# Patient Record
Sex: Female | Born: 1955 | Race: Black or African American | Hispanic: No | Smoking: Never smoker
Health system: Southern US, Community
[De-identification: ages and names within clinical notes are randomized; demographics above are authoritative.]

## PROBLEM LIST (undated history)

## (undated) DIAGNOSIS — M797 Fibromyalgia: Secondary | ICD-10-CM

## (undated) DIAGNOSIS — K219 Gastro-esophageal reflux disease without esophagitis: Secondary | ICD-10-CM

## (undated) DIAGNOSIS — F32A Depression, unspecified: Secondary | ICD-10-CM

## (undated) DIAGNOSIS — N920 Excessive and frequent menstruation with regular cycle: Secondary | ICD-10-CM

## (undated) DIAGNOSIS — M1990 Unspecified osteoarthritis, unspecified site: Secondary | ICD-10-CM

## (undated) DIAGNOSIS — M5126 Other intervertebral disc displacement, lumbar region: Secondary | ICD-10-CM

## (undated) DIAGNOSIS — F329 Major depressive disorder, single episode, unspecified: Secondary | ICD-10-CM

## (undated) DIAGNOSIS — I10 Essential (primary) hypertension: Secondary | ICD-10-CM

## (undated) HISTORY — DX: Depression, unspecified: F32.A

## (undated) HISTORY — DX: Major depressive disorder, single episode, unspecified: F32.9

## (undated) HISTORY — PX: ENDOMETRIAL ABLATION: SHX621

## (undated) HISTORY — PX: CHOLECYSTECTOMY: SHX55

## (undated) HISTORY — DX: Excessive and frequent menstruation with regular cycle: N92.0

## (undated) HISTORY — DX: Unspecified osteoarthritis, unspecified site: M19.90

## (undated) HISTORY — PX: MOUTH SURGERY: SHX715

## (undated) HISTORY — DX: Essential (primary) hypertension: I10

## (undated) HISTORY — PX: ESOPHAGEAL DILATION: SHX303

## (undated) HISTORY — DX: Fibromyalgia: M79.7

---

## 2008-07-23 ENCOUNTER — Emergency Department (HOSPITAL_COMMUNITY): Admission: EM | Admit: 2008-07-23 | Discharge: 2008-07-23 | Payer: Self-pay | Admitting: Emergency Medicine

## 2008-07-28 ENCOUNTER — Ambulatory Visit: Payer: Self-pay | Admitting: *Deleted

## 2008-08-01 ENCOUNTER — Emergency Department (HOSPITAL_COMMUNITY): Admission: EM | Admit: 2008-08-01 | Discharge: 2008-08-01 | Payer: Self-pay | Admitting: Family Medicine

## 2008-08-07 ENCOUNTER — Ambulatory Visit (HOSPITAL_COMMUNITY): Admission: RE | Admit: 2008-08-07 | Discharge: 2008-08-07 | Payer: Self-pay | Admitting: Family Medicine

## 2008-08-28 ENCOUNTER — Ambulatory Visit: Payer: Self-pay | Admitting: Family Medicine

## 2008-08-28 LAB — CONVERTED CEMR LAB
ALT: 13 units/L (ref 0–35)
Albumin: 4.3 g/dL (ref 3.5–5.2)
Basophils Absolute: 0 10*3/uL (ref 0.0–0.1)
Basophils Relative: 1 % (ref 0–1)
Calcium: 9.6 mg/dL (ref 8.4–10.5)
Chloride: 104 meq/L (ref 96–112)
Eosinophils Relative: 2 % (ref 0–5)
Glucose, Bld: 81 mg/dL (ref 70–99)
Hemoglobin: 10.9 g/dL — ABNORMAL LOW (ref 12.0–15.0)
Lymphocytes Relative: 52 % — ABNORMAL HIGH (ref 12–46)
Lymphs Abs: 2 10*3/uL (ref 0.7–4.0)
Monocytes Relative: 8 % (ref 3–12)
TSH: 1.427 microintl units/mL (ref 0.350–4.50)
Total Bilirubin: 0.6 mg/dL (ref 0.3–1.2)

## 2008-09-21 ENCOUNTER — Ambulatory Visit: Payer: Self-pay | Admitting: Internal Medicine

## 2008-10-28 ENCOUNTER — Ambulatory Visit: Payer: Self-pay | Admitting: Internal Medicine

## 2008-11-18 ENCOUNTER — Ambulatory Visit (HOSPITAL_COMMUNITY): Admission: RE | Admit: 2008-11-18 | Discharge: 2008-11-18 | Payer: Self-pay | Admitting: Family Medicine

## 2008-11-18 ENCOUNTER — Emergency Department (HOSPITAL_COMMUNITY): Admission: EM | Admit: 2008-11-18 | Discharge: 2008-11-18 | Payer: Self-pay | Admitting: Family Medicine

## 2008-12-10 ENCOUNTER — Ambulatory Visit: Payer: Self-pay | Admitting: Family Medicine

## 2008-12-28 ENCOUNTER — Encounter: Admission: RE | Admit: 2008-12-28 | Discharge: 2009-01-28 | Payer: Self-pay | Admitting: Family Medicine

## 2009-01-04 ENCOUNTER — Encounter: Payer: Self-pay | Admitting: Internal Medicine

## 2009-01-04 ENCOUNTER — Ambulatory Visit: Payer: Self-pay | Admitting: Family Medicine

## 2009-01-04 LAB — CONVERTED CEMR LAB
ALT: 11 units/L (ref 0–35)
Albumin: 4.2 g/dL (ref 3.5–5.2)
Alkaline Phosphatase: 88 units/L (ref 39–117)
Basophils Absolute: 0 10*3/uL (ref 0.0–0.1)
Calcium: 8.9 mg/dL (ref 8.4–10.5)
Hemoglobin: 10.6 g/dL — ABNORMAL LOW (ref 12.0–15.0)
Lymphocytes Relative: 53 % — ABNORMAL HIGH (ref 12–46)
Lymphs Abs: 2 10*3/uL (ref 0.7–4.0)
MCHC: 31.4 g/dL (ref 30.0–36.0)
Monocytes Absolute: 0.3 10*3/uL (ref 0.1–1.0)
Neutro Abs: 1.4 10*3/uL — ABNORMAL LOW (ref 1.7–7.7)
Neutrophils Relative %: 37 % — ABNORMAL LOW (ref 43–77)
Platelets: 424 10*3/uL — ABNORMAL HIGH (ref 150–400)
TSH: 1.808 microintl units/mL (ref 0.350–4.50)
Total Bilirubin: 0.4 mg/dL (ref 0.3–1.2)
Total Protein: 7.4 g/dL (ref 6.0–8.3)
WBC: 3.7 10*3/uL — ABNORMAL LOW (ref 4.0–10.5)

## 2009-01-14 ENCOUNTER — Ambulatory Visit: Payer: Self-pay | Admitting: Internal Medicine

## 2009-01-15 ENCOUNTER — Ambulatory Visit: Payer: Self-pay | Admitting: Internal Medicine

## 2009-01-27 ENCOUNTER — Ambulatory Visit: Payer: Self-pay | Admitting: Obstetrics & Gynecology

## 2009-01-27 LAB — CONVERTED CEMR LAB
HCT: 31.9 % — ABNORMAL LOW (ref 36.0–46.0)
MCV: 75.1 fL — ABNORMAL LOW (ref 78.0–100.0)
RBC: 4.25 M/uL (ref 3.87–5.11)

## 2009-01-29 ENCOUNTER — Ambulatory Visit (HOSPITAL_COMMUNITY): Admission: RE | Admit: 2009-01-29 | Discharge: 2009-01-29 | Payer: Self-pay | Admitting: Obstetrics and Gynecology

## 2009-02-18 ENCOUNTER — Ambulatory Visit: Payer: Self-pay | Admitting: Obstetrics and Gynecology

## 2009-02-25 ENCOUNTER — Ambulatory Visit: Payer: Self-pay | Admitting: Family Medicine

## 2009-02-26 ENCOUNTER — Other Ambulatory Visit: Admission: RE | Admit: 2009-02-26 | Discharge: 2009-02-26 | Payer: Self-pay | Admitting: Obstetrics & Gynecology

## 2009-02-26 ENCOUNTER — Ambulatory Visit: Payer: Self-pay | Admitting: Obstetrics & Gynecology

## 2009-03-05 ENCOUNTER — Ambulatory Visit (HOSPITAL_COMMUNITY): Admission: RE | Admit: 2009-03-05 | Discharge: 2009-03-05 | Payer: Self-pay | Admitting: Family Medicine

## 2009-05-13 ENCOUNTER — Ambulatory Visit: Payer: Self-pay | Admitting: Obstetrics and Gynecology

## 2009-05-13 ENCOUNTER — Encounter: Payer: Self-pay | Admitting: Obstetrics and Gynecology

## 2009-06-10 ENCOUNTER — Ambulatory Visit: Payer: Self-pay | Admitting: Family Medicine

## 2009-07-14 ENCOUNTER — Inpatient Hospital Stay (HOSPITAL_COMMUNITY): Admission: AD | Admit: 2009-07-14 | Discharge: 2009-07-14 | Payer: Self-pay | Admitting: Obstetrics & Gynecology

## 2009-08-10 ENCOUNTER — Ambulatory Visit (HOSPITAL_COMMUNITY): Admission: RE | Admit: 2009-08-10 | Discharge: 2009-08-10 | Payer: Self-pay | Admitting: Family Medicine

## 2009-08-25 ENCOUNTER — Ambulatory Visit: Payer: Self-pay | Admitting: Advanced Practice Midwife

## 2009-08-25 ENCOUNTER — Inpatient Hospital Stay (HOSPITAL_COMMUNITY): Admission: AD | Admit: 2009-08-25 | Discharge: 2009-08-25 | Payer: Self-pay | Admitting: Obstetrics & Gynecology

## 2009-08-27 ENCOUNTER — Inpatient Hospital Stay (HOSPITAL_COMMUNITY): Admission: RE | Admit: 2009-08-27 | Discharge: 2009-08-27 | Payer: Self-pay | Admitting: Family Medicine

## 2009-08-27 ENCOUNTER — Ambulatory Visit: Payer: Self-pay | Admitting: Obstetrics and Gynecology

## 2009-09-08 ENCOUNTER — Ambulatory Visit: Payer: Self-pay | Admitting: Obstetrics and Gynecology

## 2009-09-16 ENCOUNTER — Ambulatory Visit: Payer: Self-pay | Admitting: Obstetrics & Gynecology

## 2009-09-16 ENCOUNTER — Encounter: Payer: Self-pay | Admitting: Obstetrics & Gynecology

## 2009-09-16 ENCOUNTER — Ambulatory Visit (HOSPITAL_COMMUNITY): Admission: RE | Admit: 2009-09-16 | Discharge: 2009-09-16 | Payer: Self-pay | Admitting: Obstetrics & Gynecology

## 2009-10-02 ENCOUNTER — Emergency Department (HOSPITAL_COMMUNITY): Admission: EM | Admit: 2009-10-02 | Discharge: 2009-10-02 | Payer: Self-pay | Admitting: Emergency Medicine

## 2009-10-14 ENCOUNTER — Ambulatory Visit: Payer: Self-pay | Admitting: Obstetrics and Gynecology

## 2009-11-02 ENCOUNTER — Ambulatory Visit: Payer: Self-pay | Admitting: Family Medicine

## 2009-12-14 ENCOUNTER — Telehealth (INDEPENDENT_AMBULATORY_CARE_PROVIDER_SITE_OTHER): Payer: Self-pay | Admitting: *Deleted

## 2009-12-16 ENCOUNTER — Ambulatory Visit: Payer: Self-pay | Admitting: Adult Health

## 2009-12-16 ENCOUNTER — Encounter (INDEPENDENT_AMBULATORY_CARE_PROVIDER_SITE_OTHER): Payer: Self-pay | Admitting: Family Medicine

## 2009-12-16 LAB — CONVERTED CEMR LAB
BUN: 11 mg/dL
CO2: 24 meq/L
Calcium: 8.7 mg/dL
Chloride: 105 meq/L
Creatinine, Ser: 0.68 mg/dL
Glucose, Bld: 99 mg/dL
Potassium: 3.5 meq/L
Sodium: 140 meq/L

## 2009-12-22 ENCOUNTER — Ambulatory Visit (HOSPITAL_COMMUNITY): Admission: RE | Admit: 2009-12-22 | Discharge: 2009-12-22 | Payer: Self-pay | Admitting: Family Medicine

## 2010-01-03 ENCOUNTER — Telehealth (INDEPENDENT_AMBULATORY_CARE_PROVIDER_SITE_OTHER): Payer: Self-pay | Admitting: *Deleted

## 2010-01-04 ENCOUNTER — Ambulatory Visit: Payer: Self-pay | Admitting: Family Medicine

## 2010-01-04 ENCOUNTER — Encounter (INDEPENDENT_AMBULATORY_CARE_PROVIDER_SITE_OTHER): Payer: Self-pay | Admitting: Adult Health

## 2010-01-04 LAB — CONVERTED CEMR LAB
AST: 15 units/L (ref 0–37)
Albumin: 4.5 g/dL (ref 3.5–5.2)
Alkaline Phosphatase: 102 units/L (ref 39–117)
Basophils Relative: 1 % (ref 0–1)
Calcium: 8.9 mg/dL (ref 8.4–10.5)
Chloride: 102 meq/L (ref 96–112)
Creatinine, Ser: 0.86 mg/dL (ref 0.40–1.20)
Eosinophils Absolute: 0.1 10*3/uL (ref 0.0–0.7)
Glucose, Bld: 91 mg/dL (ref 70–99)
HCT: 35.5 % — ABNORMAL LOW (ref 36.0–46.0)
Helicobacter Pylori Antibody-IgG: 1.1 — ABNORMAL HIGH
Hemoglobin: 11.5 g/dL — ABNORMAL LOW (ref 12.0–15.0)
Lymphocytes Relative: 51 % — ABNORMAL HIGH (ref 12–46)
Monocytes Absolute: 0.3 10*3/uL (ref 0.1–1.0)
Platelets: 408 10*3/uL — ABNORMAL HIGH (ref 150–400)
Potassium: 4 meq/L (ref 3.5–5.3)
RBC: 4.73 M/uL (ref 3.87–5.11)
RDW: 18.3 % — ABNORMAL HIGH (ref 11.5–15.5)
Total Protein: 7.7 g/dL (ref 6.0–8.3)

## 2010-01-13 ENCOUNTER — Ambulatory Visit: Payer: Self-pay | Admitting: Family Medicine

## 2010-03-08 ENCOUNTER — Encounter (INDEPENDENT_AMBULATORY_CARE_PROVIDER_SITE_OTHER): Payer: Self-pay | Admitting: Adult Health

## 2010-03-08 ENCOUNTER — Ambulatory Visit: Payer: Self-pay | Admitting: Family Medicine

## 2010-03-08 LAB — CONVERTED CEMR LAB
Amylase: 47 units/L (ref 0–105)
Basophils Absolute: 0 10*3/uL (ref 0.0–0.1)
Basophils Relative: 1 % (ref 0–1)
Eosinophils Relative: 2 % (ref 0–5)
Hemoglobin: 12.7 g/dL (ref 12.0–15.0)
Lipase: 20 units/L (ref 0–75)
Lymphocytes Relative: 59 % — ABNORMAL HIGH (ref 12–46)
MCHC: 34 g/dL (ref 30.0–36.0)
Microalb, Ur: 0.5 mg/dL (ref 0.00–1.89)
WBC: 3.2 10*3/uL — ABNORMAL LOW (ref 4.0–10.5)

## 2010-03-31 ENCOUNTER — Ambulatory Visit: Payer: Self-pay | Admitting: Family Medicine

## 2010-10-11 IMAGING — US US PELVIS COMPLETE MODIFY
1 series · 13 of 25 positions shown · non-contrast
Comparison: None

CLINICAL DATA: Dysfunctional uterine bleeding.  LMP 01/06/2009.

TRANSABDOMINAL AND TRANSVAGINAL ULTRASOUND OF PELVIS
TECHNIQUE: Both transabdominal and transvaginal ultrasound
examinations of the pelvis were performed including evaluation of
the uterus, ovaries, adnexal regions, and pelvic cul-de-sac.

[Series 1: us transvaginal non-ob · 13 of 49 slices shown]
[im 1/49]
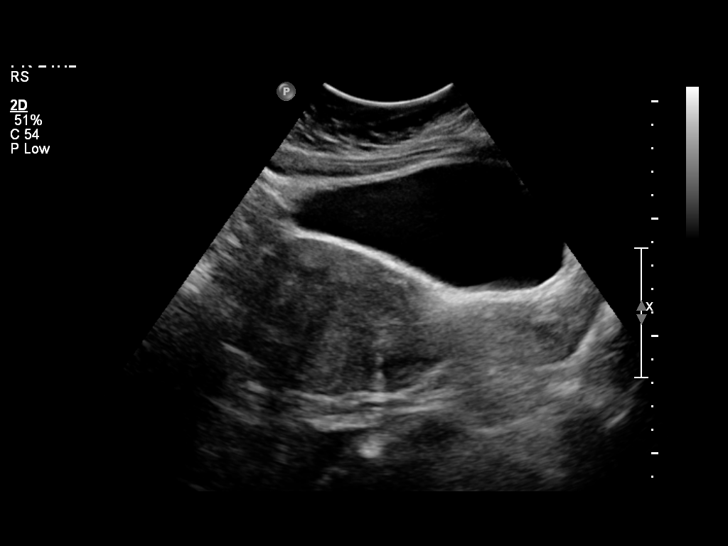
[im 5/49]
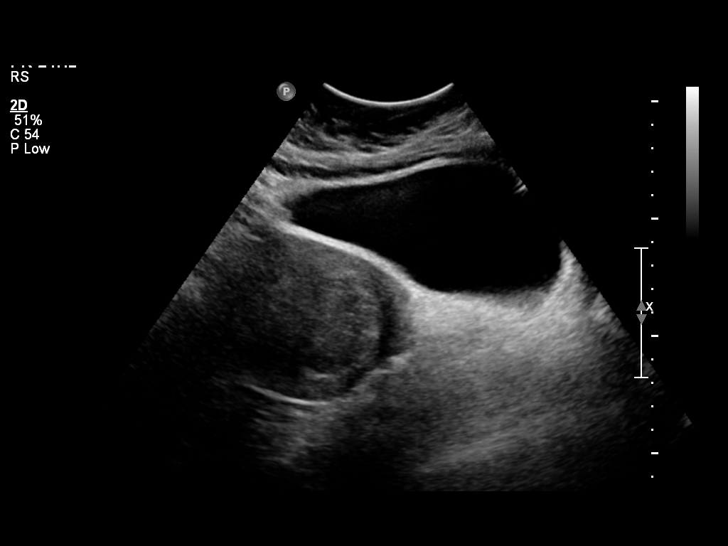
[im 9/49]
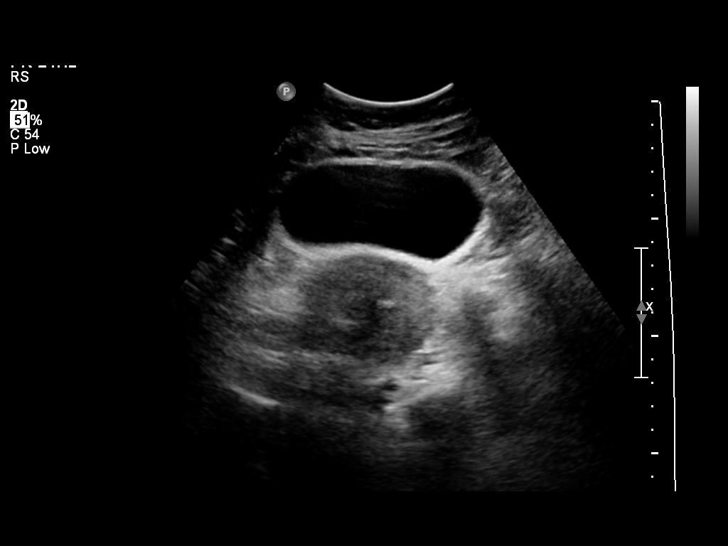
[im 13/49]
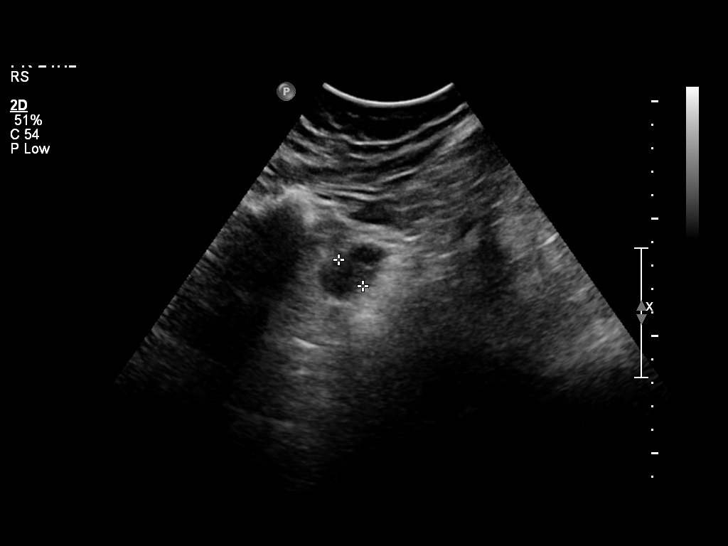
[im 17/49]
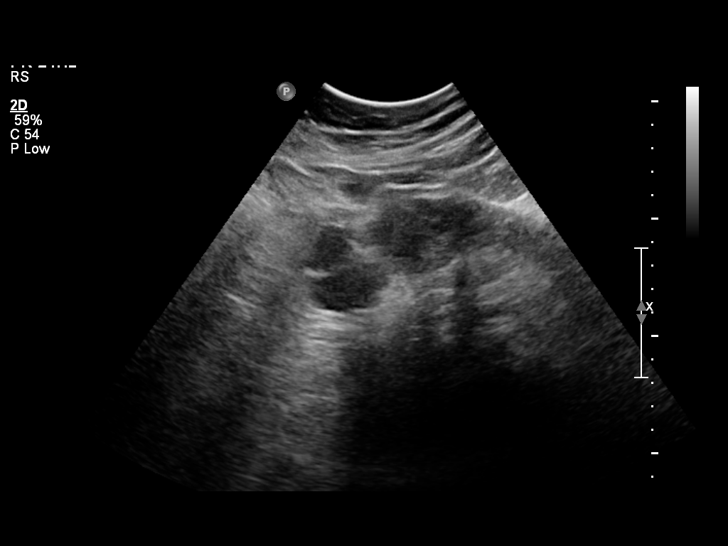
[im 21/49]
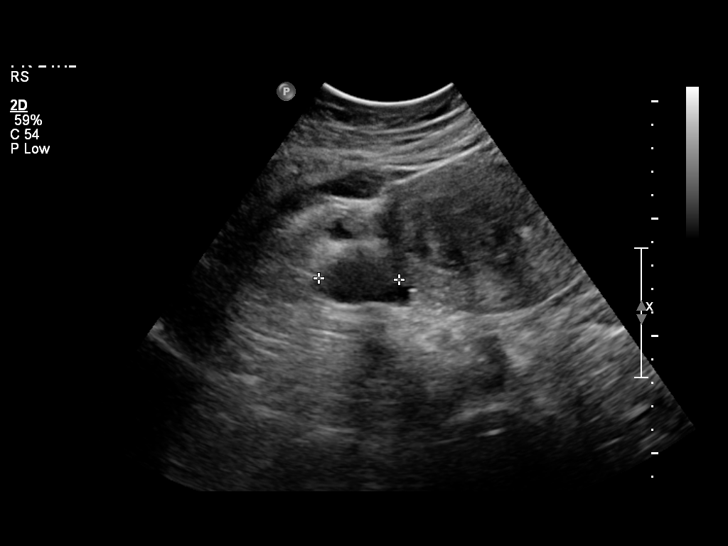
[im 25/49]
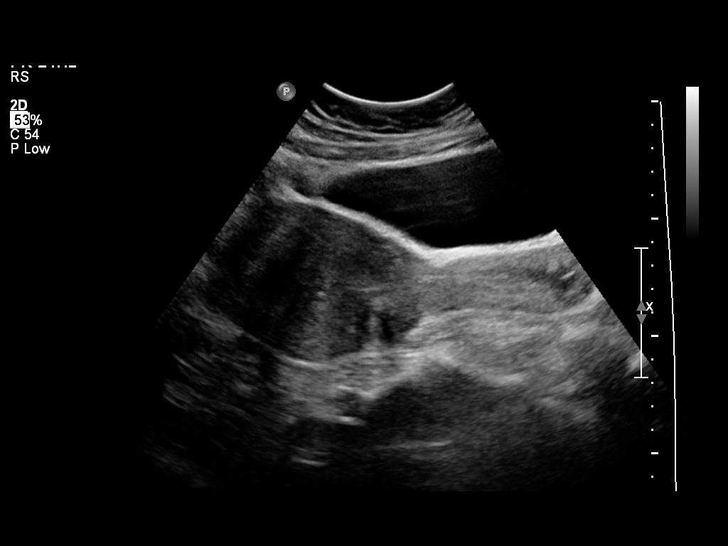
[im 29/49]
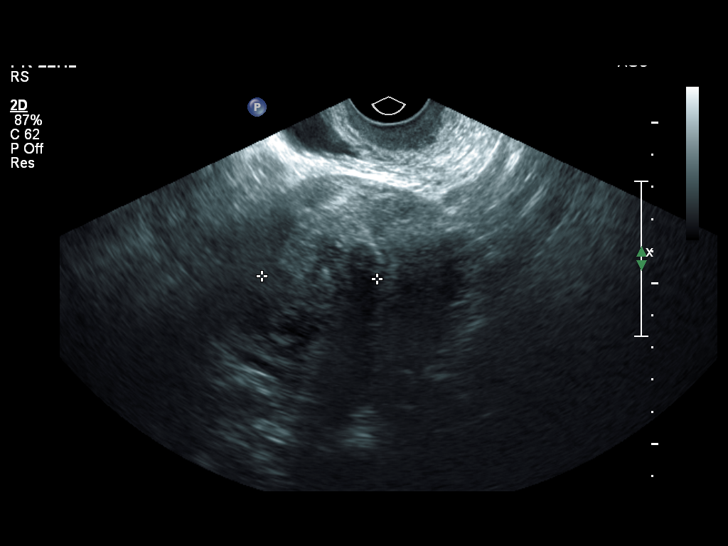
[im 33/49]
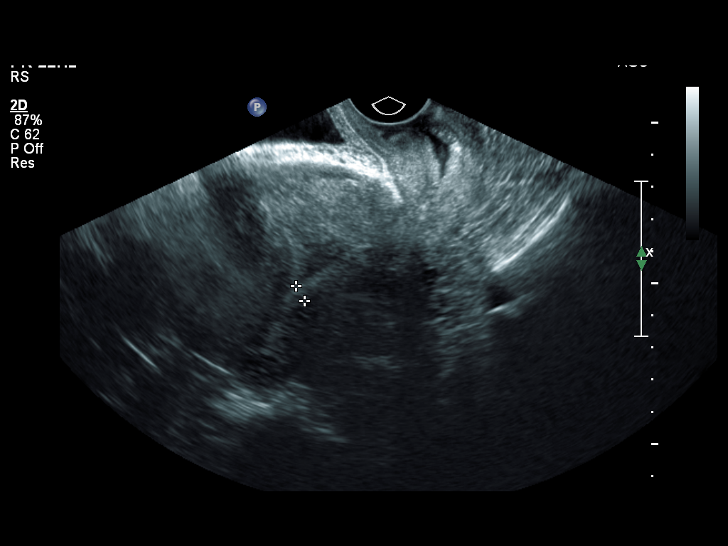
[im 37/49]
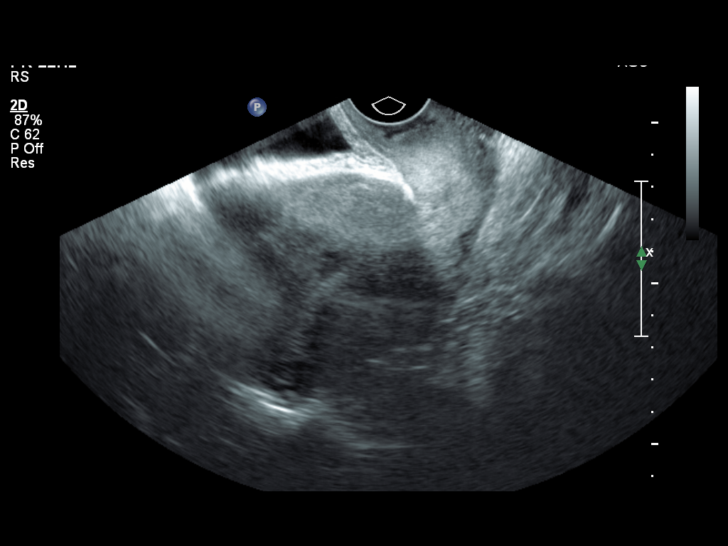
[im 41/49]
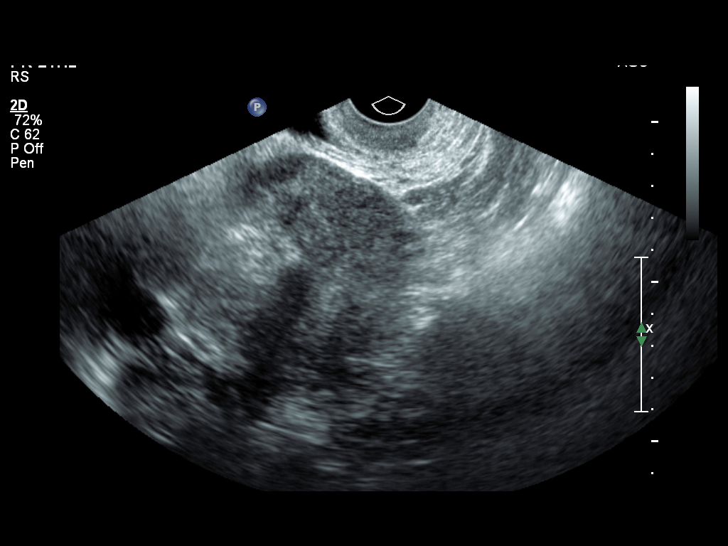
[im 45/49]
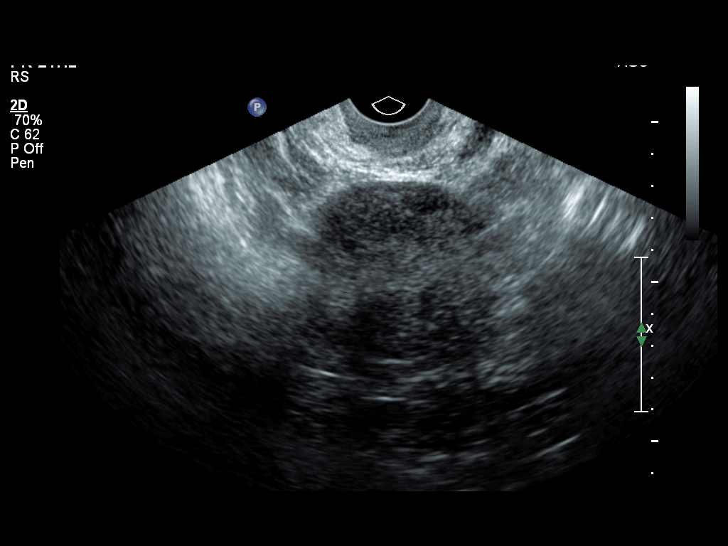
[im 49/49]
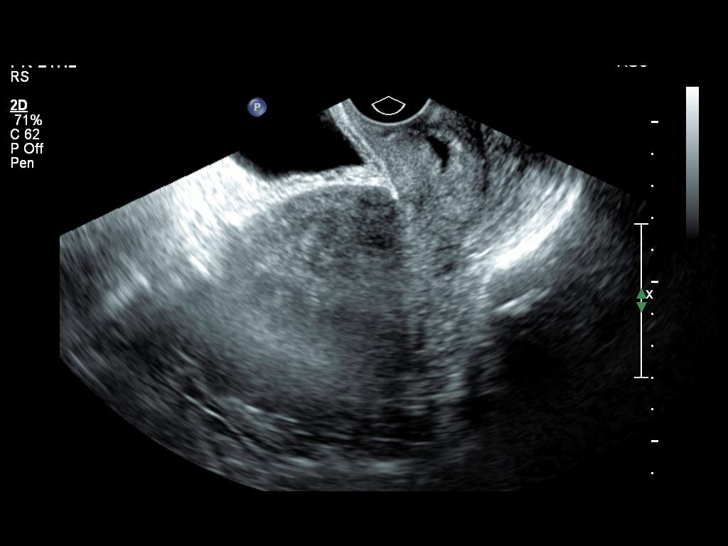

[13 of 25 positions shown; findings below may reference images not displayed]

FINDINGS: Uterus:  The uterus is enlarged with a sagittal length measured
transabdominally of 14.5 cm, an AP width of 6.6 cm and a transverse
width of 7.4 cm.  Endovaginally the fundal portion of the uterus is
incompletely assessed due to the prominent uterine size.  One focal
fibroid is identified involving the right lateral upper uterine
segment measuring 4.5 x 3.9 x 3.6 cm.  More diffuse fibroid
involvement of the fundus may be present but poorly evaluated due
to the poor visibility of the fundal portion of the uterus
endovaginally.

Endometrium:  The endometrial lining to the midportion of the
uterus is thin and echogenic with an AP width of 5.4 mm.  The
fundal portion of the endometrium is obscured by the aforementioned
fibroids and incompletely evaluated.  The possibility of a partial
submucosal component to the patient's measurable fibroid exists.

Right Ovary:  The right ovary measures 3.3 x 1.8 x 3.4 cm and is
also best seen transabdominally.

Left Ovary:  The left ovary is seen transabdominally measuring
x 1.8 x 2.9 cm.

Other Findings:  No pelvic fluid is noted.
IMPRESSION: Enlarged uterine size with one measurable focal fibroid.  Poor
evaluation ofthe uterine fundus is possible due to the imaging
characteristics transabdominally combined with inability to
visualize the fundus endovaginally.  More diffuse fibroid
involvement may be present.  Incomplete visualization of the
endometrial lining as described above.  The visualized portion of
the lining appears unremarkable. If further assessment of the
relationship of the endometrial lining to the patient's.
measurable fibroid as well as evaluation for non-visualized
fibroids is desired, MRI would be recommended for further
evaluation.

Normal ovaries.

## 2010-11-01 ENCOUNTER — Encounter (INDEPENDENT_AMBULATORY_CARE_PROVIDER_SITE_OTHER): Payer: Self-pay | Admitting: Family Medicine

## 2010-11-01 LAB — CONVERTED CEMR LAB
Creatinine, Ser: 0.7 mg/dL (ref 0.40–1.20)
Glucose, Bld: 85 mg/dL (ref 70–99)
Sodium: 144 meq/L (ref 135–145)

## 2010-11-03 ENCOUNTER — Ambulatory Visit: Payer: Self-pay | Admitting: Family Medicine

## 2010-11-03 ENCOUNTER — Ambulatory Visit (HOSPITAL_COMMUNITY)
Admission: RE | Admit: 2010-11-03 | Discharge: 2010-11-03 | Payer: Self-pay | Source: Home / Self Care | Attending: Family Medicine | Admitting: Family Medicine

## 2010-11-03 ENCOUNTER — Encounter: Payer: Self-pay | Admitting: Family Medicine

## 2010-11-03 LAB — CONVERTED CEMR LAB
FSH: 56.2 milliintl units/mL
HCT: 36.6 % (ref 36.0–46.0)
Hemoglobin: 12.7 g/dL (ref 12.0–15.0)
Pap Smear: NEGATIVE
RBC: 4.26 M/uL (ref 3.87–5.11)
RDW: 13.1 % (ref 11.5–15.5)
TSH: 1.037 microintl units/mL (ref 0.350–4.500)

## 2010-11-09 ENCOUNTER — Ambulatory Visit (HOSPITAL_COMMUNITY)
Admission: RE | Admit: 2010-11-09 | Discharge: 2010-11-09 | Payer: Self-pay | Source: Home / Self Care | Attending: Gastroenterology | Admitting: Gastroenterology

## 2010-11-17 ENCOUNTER — Ambulatory Visit (HOSPITAL_COMMUNITY): Admission: RE | Admit: 2010-11-17 | Payer: Self-pay | Source: Home / Self Care | Admitting: Gastroenterology

## 2010-12-13 NOTE — Progress Notes (Signed)
Summary: triage/heart palpatations  Phone Note Call from Patient   Caller: Patient Reason for Call: Talk to Nurse Summary of Call: patient stated she felt she was having a side effect from medication.  She is talking doxazosin mesylate 2 mg daily at HS..she began taking 1/2 pill for 7 days and then when the dose increased to one she would have occaisionaly heart palpatations..She is also taking 1/2 triamterene/HCTZ daily in am..She states her feet are swelling.  She states she cannot come in today d/t no transportation.Marland KitchenMarland KitchenShe understands should she has chest pain/severe SOB she should go to ED.Marland KitchenMarland KitchenAppointment made tomorrow per Dr. Audria Nine..patient will be in [facility].Marland KitchenMarland KitchenShe will need an EKG (none in chart) Initial call taken by: Conchita Paris,  December 14, 2009 3:39 PM

## 2010-12-13 NOTE — Progress Notes (Signed)
Summary: triage/difficulty swollowing  Phone Note Call from Patient   Reason for Call: Talk to Nurse Summary of Call: Patient states she is having trouble swollowing...has a history of having her esphagus stretched for this problem.Marland KitchenMarland KitchenShe states she had been taking Nexium and was off of it..Picked up rx for acid reflux and has been taking for a few days..She also states she has been having chest pain but feels like it is acid reflux because she has it after she eats. Patient has a history of heart palpations and had an EKG which patient states was normal..Offered patient an appointment today, but she doesn't have transportation, so appointment was made for tomorrow. Advised patient to call 911 should her chest pains persist unrelieved by anacid, numbness in hands. Patient states understanding Initial call taken by: Conchita Paris,  January 03, 2010 11:59 AM

## 2010-12-14 ENCOUNTER — Encounter: Payer: Self-pay | Admitting: Gastroenterology

## 2010-12-15 ENCOUNTER — Other Ambulatory Visit (HOSPITAL_COMMUNITY): Payer: Self-pay

## 2010-12-15 LAB — COMPREHENSIVE METABOLIC PANEL
ALT: 27 U/L (ref 0–35)
Albumin: 3.9 g/dL (ref 3.5–5.2)
Alkaline Phosphatase: 120 U/L — ABNORMAL HIGH (ref 39–117)
BUN: 14 mg/dL (ref 6–23)
CO2: 28 mEq/L (ref 19–32)
Calcium: 9.5 mg/dL (ref 8.4–10.5)
Creatinine, Ser: 0.72 mg/dL (ref 0.4–1.2)
GFR calc non Af Amer: >60 mL/min (ref >60)
Sodium: 138 mEq/L (ref 135–145)
Total Bilirubin: 0.8 mg/dL (ref 0.3–1.2)
Total Protein: 7.6 g/dL (ref 6.0–8.3)

## 2010-12-15 LAB — CBC
HCT: 36.9 % (ref 36.0–46.0)
Hemoglobin: 13 g/dL (ref 12.0–15.0)
Platelets: 310 10*3/uL (ref 150–400)
RBC: 4.32 MIL/uL (ref 3.87–5.11)
WBC: 2.9 10*3/uL — ABNORMAL LOW (ref 4.0–10.5)

## 2010-12-15 LAB — SURGICAL PCR SCREEN: Staphylococcus aureus: NEGATIVE

## 2010-12-22 ENCOUNTER — Ambulatory Visit (HOSPITAL_COMMUNITY)
Admission: RE | Admit: 2010-12-22 | Discharge: 2010-12-22 | Disposition: A | Discharge status: Home / Self Care | Payer: Self-pay | Source: Ambulatory Visit | Attending: Surgery | Admitting: Surgery

## 2010-12-22 ENCOUNTER — Other Ambulatory Visit: Payer: Self-pay | Admitting: Surgery

## 2010-12-22 DIAGNOSIS — K8020 Calculus of gallbladder without cholecystitis without obstruction: Secondary | ICD-10-CM | POA: Insufficient documentation

## 2010-12-22 DIAGNOSIS — K8010 Calculus of gallbladder with chronic cholecystitis without obstruction: Secondary | ICD-10-CM | POA: Insufficient documentation

## 2010-12-28 NOTE — Op Note (Signed)
  NAMESHERISSE, Carolyn Wall             ACCOUNT NO.:  [id number]  MEDICAL RECORD NO.:  [id number]           PATIENT TYPE:  O  LOCATION:  [facility]                         FACILITY:  [facility]  PHYSICIAN:  Abigail Miyamoto, M.D. DATE OF BIRTH:  10-Apr-1956  DATE OF PROCEDURE:  12/22/2010 DATE OF DISCHARGE:                              OPERATIVE REPORT   PREOPERATIVE DIAGNOSIS:  Symptomatic cholelithiasis.  POSTOPERATIVE DIAGNOSIS:  Symptomatic cholelithiasis.  PROCEDURE:  Laparoscopic cholecystectomy.  SURGEON:  Abigail Miyamoto, M.D.  ASSISTANT:  Ardeth Sportsman, MD.  ANESTHESIA:  General endotracheal anesthesia and 0.5% Marcaine.  ESTIMATED BLOOD LOSS:  Minimal.  FINDINGS:  The patient has minimally scarred-appearing gallbladder with gallstones.  PROCEDURE IN DETAIL:  The patient was brought to the Operating Room, identified as [name].  She was placed supine on the operating table and general anesthesia was induced.  Her abdomen was prepped and draped in usual sterile fashion.  Using #15 blade, a small vertical incision was made above the umbilicus.  This was carried down to the fascia which was then opened scalpel.  A hemostat was used to pass the peritoneal cavity under direct vision.  Next, a 0 Vicryl sutures placed on the fascial opening.  The [facility] port was placed at the opening and insufflation of the abdomen begun.  A 5-mm port was then placed in epigastrium and two more 5-mm ports were placed in the patient's upper quadrant all under direct vision.  The patient had several adhesions from the dome of the liver to the diaphragm.  I took these down with the laparoscopic scissors.  I was then able to easily grasp the gallbladder and retracted it above the liver bed.  Dissection was then carried out the base the gallbladder.  The cystic duct was easily dissected out and window was achieved around.  It was clipped three times proximally, once distally and transected.  The  cystic artery posterior branches identified clip proximally, distally and transected as well.  The gallbladder was slowly dissected free from the liver bed with electrocautery.  Once it was free from liver bed, hemostasis was achieved with cautery.  The gallbladder was placed in endosac and removed through the incision at the umbilicus.  The abdomen was then copiously irrigated with normal saline.  Again, hemostasis appeared be achieved.  All ports removed under direct vision.  The abdomen was deflated.  All incisions were then anesthetized with Marcaine.  The 0 Vicryl umbilicus was closed the fascial defect.  All skin incisions closed with 4-0 Monocryl sutures and skin adhesive.  Band-Aids were then applied.  The patient tolerated the procedure well.  All counts were correct at the end of procedure.  The patient was then extubated in Operating Room and taken in stable condition to recovery room.     Abigail Miyamoto, M.D.     DB/MEDQ  D:  12/22/2010  T:  12/22/2010  Job:  [id number]  Electronically Signed by Abigail Miyamoto M.D. on 12/28/2010 07:29:33 PM

## 2011-02-06 ENCOUNTER — Ambulatory Visit (HOSPITAL_COMMUNITY)
Admission: RE | Admit: 2011-02-06 | Discharge: 2011-02-06 | Disposition: A | Discharge status: Home / Self Care | Payer: Self-pay | Source: Ambulatory Visit | Attending: Family Medicine | Admitting: Family Medicine

## 2011-02-06 ENCOUNTER — Other Ambulatory Visit (HOSPITAL_COMMUNITY): Payer: Self-pay | Admitting: Family Medicine

## 2011-02-06 DIAGNOSIS — M169 Osteoarthritis of hip, unspecified: Secondary | ICD-10-CM | POA: Insufficient documentation

## 2011-02-06 DIAGNOSIS — M1610 Unilateral primary osteoarthritis, unspecified hip: Secondary | ICD-10-CM | POA: Insufficient documentation

## 2011-02-06 DIAGNOSIS — M545 Low back pain, unspecified: Secondary | ICD-10-CM | POA: Insufficient documentation

## 2011-02-06 DIAGNOSIS — R52 Pain, unspecified: Secondary | ICD-10-CM

## 2011-02-06 DIAGNOSIS — M19019 Primary osteoarthritis, unspecified shoulder: Secondary | ICD-10-CM | POA: Insufficient documentation

## 2011-02-06 DIAGNOSIS — M25519 Pain in unspecified shoulder: Secondary | ICD-10-CM | POA: Insufficient documentation

## 2011-02-06 DIAGNOSIS — M25559 Pain in unspecified hip: Secondary | ICD-10-CM | POA: Insufficient documentation

## 2011-02-15 LAB — CBC
HCT: 33.4 % — ABNORMAL LOW (ref 36.0–46.0)
HCT: 30.6 % — ABNORMAL LOW (ref 36.0–46.0)
Hemoglobin: 10.9 g/dL — ABNORMAL LOW (ref 12.0–15.0)
Hemoglobin: 9.9 g/dL — ABNORMAL LOW (ref 12.0–15.0)
MCHC: 32.7 g/dL (ref 30.0–36.0)
MCHC: 32.2 g/dL (ref 30.0–36.0)
MCV: 80.4 fL (ref 78.0–100.0)
Platelets: 457 10*3/uL — ABNORMAL HIGH (ref 150–400)
RBC: 3.8 MIL/uL — ABNORMAL LOW (ref 3.87–5.11)
RDW: 18.6 % — ABNORMAL HIGH (ref 11.5–15.5)
RDW: 16.9 % — ABNORMAL HIGH (ref 11.5–15.5)

## 2011-02-15 LAB — URINALYSIS, ROUTINE W REFLEX MICROSCOPIC
Bilirubin Urine: NEGATIVE
Glucose, UA: NEGATIVE mg/dL
Ketones, ur: NEGATIVE mg/dL
Leukocytes, UA: NEGATIVE
Protein, ur: NEGATIVE mg/dL
pH: 7.5 (ref 5.0–8.0)

## 2011-02-15 LAB — BASIC METABOLIC PANEL
CO2: 28 mEq/L (ref 19–32)
Chloride: 103 mEq/L (ref 96–112)
GFR calc Af Amer: >60 mL/min (ref >60)
Glucose, Bld: 107 mg/dL — ABNORMAL HIGH (ref 70–99)
Potassium: 3.8 mEq/L (ref 3.5–5.1)
Sodium: 137 mEq/L (ref 135–145)

## 2011-02-15 LAB — COMPREHENSIVE METABOLIC PANEL
ALT: 17 U/L (ref 0–35)
Albumin: 4.1 g/dL (ref 3.5–5.2)
Alkaline Phosphatase: 106 U/L (ref 39–117)
BUN: 10 mg/dL (ref 6–23)
Calcium: 9.3 mg/dL (ref 8.4–10.5)
Glucose, Bld: 104 mg/dL — ABNORMAL HIGH (ref 70–99)
Potassium: 3.3 mEq/L — ABNORMAL LOW (ref 3.5–5.1)
Sodium: 136 mEq/L (ref 135–145)
Total Protein: 8 g/dL (ref 6.0–8.3)

## 2011-02-15 LAB — DIFFERENTIAL
Basophils Relative: 1 % (ref 0–1)
Lymphs Abs: 1.9 10*3/uL (ref 0.7–4.0)
Monocytes Absolute: 0.4 10*3/uL (ref 0.1–1.0)
Monocytes Relative: 7 % (ref 3–12)
Neutro Abs: 3 10*3/uL (ref 1.7–7.7)
Neutrophils Relative %: 57 % (ref 43–77)

## 2011-02-15 LAB — URINE MICROSCOPIC-ADD ON

## 2011-02-16 LAB — CBC
HCT: 27.5 % — ABNORMAL LOW (ref 36.0–46.0)
Platelets: 559 10*3/uL — ABNORMAL HIGH (ref 150–400)
RBC: 3.35 MIL/uL — ABNORMAL LOW (ref 3.87–5.11)
WBC: 4 10*3/uL (ref 4.0–10.5)

## 2011-02-17 LAB — CBC
HCT: 31.3 % — ABNORMAL LOW (ref 36.0–46.0)
Hemoglobin: 10.6 g/dL — ABNORMAL LOW (ref 12.0–15.0)
MCHC: 33.8 g/dL (ref 30.0–36.0)
RBC: 3.52 MIL/uL — ABNORMAL LOW (ref 3.87–5.11)

## 2011-02-27 LAB — POCT URINALYSIS DIP (DEVICE)
Bilirubin Urine: NEGATIVE
Glucose, UA: NEGATIVE mg/dL
Hgb urine dipstick: NEGATIVE
Specific Gravity, Urine: 1.015 (ref 1.005–1.030)
Urobilinogen, UA: 0.2 mg/dL (ref 0.0–1.0)
pH: 7.5 (ref 5.0–8.0)

## 2011-03-28 NOTE — Group Therapy Note (Signed)
NAMEZAMYIAH, TINO NO.:  [id number]   MEDICAL RECORD NO.:  [id number]          PATIENT TYPE:  WOC   LOCATION:  [facility]                   FACILITY:  [facility]   PHYSICIAN:  Scheryl Darter, MD       DATE OF BIRTH:  1956/07/16   DATE OF SERVICE:  02/26/2009                                  CLINIC NOTE   HISTORY:  The patient is scheduled for endometrial biopsy today.  The  patient has fibroid uterus and menorrhagia.  She had blood transfusion  over a year ago due to anemia, secondary to blood loss.  Dr. Argentina Donovan  deferred endometrial biopsy on 04/08 due to menstrual bleeding.  She has  no bleeding today.  Ultrasound showed uterine fibroids and a 5.4 mm  endometrium.   PHYSICAL EXAMINATION:  GENERAL:  The patient appears well.  PELVIC:  External genitalia and vagina appeared normal.  Cervix is  posterior, somewhat friable, but there are no lesions.  Uterus appears  to be about 10 weeks size.  No adnexal masses.   The cervix was prepped with Betadine and grasped with a single-tooth  tenaculum and the Marlex sampler was passed to about 9 mm and some  bloody material was obtained for pathology.  She tolerated this well.  All instruments was removed.   PLAN:  She will return in two weeks to review the result.  We will start  her on Prempro 0.625/5 mg.      Scheryl Darter, MD     JA/MEDQ  D:  02/26/2009  T:  02/26/2009  Job:  [id number]

## 2011-03-28 NOTE — Group Therapy Note (Signed)
NAMEBERNETA, SCONYERS NO.:  [id number]   MEDICAL RECORD NO.:  [id number]          PATIENT TYPE:  WOC   LOCATION:  [facility]                   FACILITY:  [facility]   PHYSICIAN:  Argentina Donovan, MD        DATE OF BIRTH:  1956-07-24   DATE OF SERVICE:  05/13/2009                                  CLINIC NOTE   The patient is a 55 year old African American female, gravida 1, para 1-  0-0-1, who has had a long history of heavy periods, see previous note.  She was in for an endometrial biopsy in April.  She had a recent  mammogram, but has not had a Pap smear in a year.  The biopsy was  benign.  The patient had been on Prempro for some time, but the periods  have continued to be heavy.  We are going to switch her since she is  nonsmoker to Loestrin FE.  She does have a history by ultrasound of some  small fibroids with a 14-week size uterus and we are close to menopause,  so I am hoping that we can buy time up until then.  If the heavy  bleeding continues, however, then probably surgical treatment may have  to be in entertained.   IMPRESSION:  Leiomyomata uteri with menorrhagia and a normal endometrial  biopsy.   PLAN:  Attempt hormonal control of the bleeding.  If unsuccessful, then  surgical treatment may be necessary.           ______________________________  Argentina Donovan, MD     PR/MEDQ  D:  05/13/2009  T:  05/13/2009  Job:  [id number]

## 2011-03-28 NOTE — Group Therapy Note (Signed)
[name], Carolyn Wall NO.:  [id number]   MEDICAL RECORD NO.:  [id number]          PATIENT TYPE:  WOC   LOCATION:  [facility]                   FACILITY:  [facility]   PHYSICIAN:  Argentina Donovan, MD        DATE OF BIRTH:  February 04, 1956   DATE OF SERVICE:                                  CLINIC NOTE   HISTORY:  This is a 55 year old African American female gravida 1, para  1-0-0-1 who has had a long history of fibroids with dysfunctional  uterine bleeding.  Last saw Dr. Marice Potter who apparently planned on eventual  perhaps hysterectomy on this patient, who was having pelvic pain,  menorrhagia.  At one time needed a transfusion.  The recent ultrasound  that she had obtained did show the normal ovaries, but the uterus  measured 14.5 cm sagittal length.  I was going to do an endometrial  biopsy today, but the patient is having heavy period.  The blood count  that Dr. Marice Potter drew showed a hemoglobin at 10.6 with hematocrit of 31.9.  The patient is taking iron.  She has mild hypertension.  Did not take  her Maxzide today, so her diastolic is up a little better.  Blood  pressure is 130/89.   I have talked to her about the possibility of hysterectomy.  I told her  we would schedule her to come in again to see Dr. Marice Potter.  Then at that  time if she was not menstruating heavily, she could do the endometrial  biopsy and decide whether or not to go ahead with surgery or try to  treat this symptomatically until over the next 2 years menopause ensued.  Hopefully the uterus would shrink down considerably if the bleeding  could be controlled up until that point, but that is for her to discuss  with the surgeon.  She is going to return.  We are going to try and  schedule her today to see Dr. Marice Potter here in the clinic.   IMPRESSION:  1. Leiomyomata uteri, symptomatic with pelvic pain and menorrhagia.  2. Secondary anemia.           ______________________________  Argentina Donovan, MD     PR/MEDQ   D:  02/18/2009  T:  02/18/2009  Job:  [id number]

## 2011-03-28 NOTE — Group Therapy Note (Signed)
NAME:  Carolyn Wall, Carolyn Wall NO.:  [id number]   MEDICAL RECORD NO.:  [id number]          PATIENT TYPE:  WOC   LOCATION:  [facility]                   FACILITY:  [facility]   PHYSICIAN:  Allie Bossier, MD        DATE OF BIRTH:  11/11/1956   DATE OF SERVICE:  01/27/2009                                  CLINIC NOTE   Ms. Weisheit is a 55 year old divorced black gravida 1, para 1 with a 32-  year-old daughter and no grandchildren.  She comes today as a referral  from [facility] for the complaint of several years' worth of very  heavy periods with clots.  She also complains of severe dysmenorrhea.  She was told in 2005 that she had fibroids, but has not had a followup  ultrasound on this recently.  She tells me that she required a blood  transfusion 1 year ago due to anemia and was told that she needed to  have her menorrhagia evaluated.  This is the soonest she has done that.   PAST MEDICAL HISTORY:  Obesity, menorrhagia, anemia, fibroids and  hypertension.   PAST SURGICAL HISTORY:  C-section, D and C in 1987, esophageal dilation  2009.   ALLERGIES:  No known drug allergies.  No latex allergies.   SOCIAL HISTORY:  Negative for tobacco, alcohol or drug use.   FAMILY HISTORY:  Significant for breast and colon cancer in a maternal  aunt who died at age 73.  She denies family history of GYN cancer.   REVIEW OF SYSTEMS:  She said her periods last about 4 days but are so  heavy that she has to wear overnight pads all the time.  She had a  colonoscopy done last year because she has a complaint of hematochezia,  but she denies any sort of intercourse for the last 5 years.  Her  mammogram was done November of 2009 and it was reportedly normal.  Her  Pap smear was also normal in 2009.  She denies a history of abnormal Pap  smears.   MEDICATIONS:  1. Maxzide 25 mg daily.  2. Allegra p.r.n.  3. Prevacid 15 mg daily.  4. Iron b.i.d.  5. Calcium 650 mg two times daily.   PHYSICAL  EXAM:  Weight 230, height 5 feet 6 inches, blood pressure  134/79, pulse 74.  She is afebrile.  EXTERNAL GENITALIA:  Normal.  ABDOMEN:  Obese and does also have a vertical scar up to her umbilicus  (from her Cesarean section).  Her uterus is palpable about 2 cm below  the umbilicus from the abdomen.  Bimanual exam confirms this and her  adnexa are not palpable.   ASSESSMENT/PLAN:  1. Menorrhagia.  2. Known history of fibroids, but no recent ultrasound.  Her exam is      consistent with very large fibroids.  Of note, she does complain of      fatigue.  3. I will be checking a complete blood count and TSH today.  4. I will be scheduling  an ultrasound and I have encouraged her to      continue her  iron therapy.  I have already discussed that she may      need a hysterectomy in the future, but that I need all my results      before we talk.  5. She will come back after her ultrasound.      Allie Bossier, MD     MCD/MEDQ  D:  01/27/2009  T:  01/27/2009  Job:  [id number]

## 2011-06-17 ENCOUNTER — Inpatient Hospital Stay (INDEPENDENT_AMBULATORY_CARE_PROVIDER_SITE_OTHER)
Admission: RE | Admit: 2011-06-17 | Discharge: 2011-06-17 | Disposition: A | Discharge status: Home / Self Care | Payer: Self-pay | Source: Ambulatory Visit | Attending: Emergency Medicine | Admitting: Emergency Medicine

## 2011-06-17 DIAGNOSIS — H15119 Episcleritis periodica fugax, unspecified eye: Secondary | ICD-10-CM

## 2011-08-30 ENCOUNTER — Ambulatory Visit: Payer: Self-pay | Admitting: Physical Therapy

## 2011-09-07 ENCOUNTER — Other Ambulatory Visit (HOSPITAL_COMMUNITY): Payer: Self-pay | Admitting: Family Medicine

## 2011-09-07 DIAGNOSIS — Z1231 Encounter for screening mammogram for malignant neoplasm of breast: Secondary | ICD-10-CM

## 2011-09-13 ENCOUNTER — Ambulatory Visit: Payer: Self-pay | Attending: Family Medicine | Admitting: Physical Therapy

## 2011-09-13 DIAGNOSIS — IMO0001 Reserved for inherently not codable concepts without codable children: Secondary | ICD-10-CM | POA: Insufficient documentation

## 2011-09-13 DIAGNOSIS — M25519 Pain in unspecified shoulder: Secondary | ICD-10-CM | POA: Insufficient documentation

## 2011-09-25 ENCOUNTER — Ambulatory Visit: Payer: Self-pay | Attending: Family Medicine | Admitting: Physical Therapy

## 2011-09-25 DIAGNOSIS — IMO0001 Reserved for inherently not codable concepts without codable children: Secondary | ICD-10-CM | POA: Insufficient documentation

## 2011-09-25 DIAGNOSIS — M25519 Pain in unspecified shoulder: Secondary | ICD-10-CM | POA: Insufficient documentation

## 2011-09-27 ENCOUNTER — Encounter: Payer: Self-pay | Admitting: Physical Therapy

## 2011-10-02 ENCOUNTER — Ambulatory Visit: Payer: Self-pay | Admitting: Physical Therapy

## 2011-10-10 ENCOUNTER — Ambulatory Visit: Payer: Self-pay | Admitting: Physical Therapy

## 2011-10-30 ENCOUNTER — Ambulatory Visit: Payer: Self-pay | Attending: Family Medicine | Admitting: Rehabilitative and Restorative Service Providers"

## 2011-10-30 DIAGNOSIS — IMO0001 Reserved for inherently not codable concepts without codable children: Secondary | ICD-10-CM | POA: Insufficient documentation

## 2011-10-30 DIAGNOSIS — M25519 Pain in unspecified shoulder: Secondary | ICD-10-CM | POA: Insufficient documentation

## 2011-11-03 ENCOUNTER — Ambulatory Visit (HOSPITAL_COMMUNITY)
Admission: RE | Admit: 2011-11-03 | Discharge: 2011-11-03 | Disposition: A | Discharge status: Home / Self Care | Payer: Self-pay | Source: Ambulatory Visit | Attending: Family Medicine | Admitting: Family Medicine

## 2011-11-03 DIAGNOSIS — Z1231 Encounter for screening mammogram for malignant neoplasm of breast: Secondary | ICD-10-CM | POA: Insufficient documentation

## 2011-11-09 ENCOUNTER — Ambulatory Visit: Payer: Self-pay | Admitting: Rehabilitation

## 2011-11-16 ENCOUNTER — Ambulatory Visit: Payer: Self-pay | Attending: Family Medicine | Admitting: Rehabilitative and Restorative Service Providers"

## 2011-11-16 DIAGNOSIS — IMO0001 Reserved for inherently not codable concepts without codable children: Secondary | ICD-10-CM | POA: Insufficient documentation

## 2011-11-16 DIAGNOSIS — M25519 Pain in unspecified shoulder: Secondary | ICD-10-CM | POA: Insufficient documentation

## 2011-11-21 ENCOUNTER — Ambulatory Visit: Payer: Self-pay | Admitting: Rehabilitative and Restorative Service Providers"

## 2011-11-23 ENCOUNTER — Ambulatory Visit: Payer: Self-pay | Admitting: Physical Therapy

## 2011-11-24 ENCOUNTER — Telehealth: Payer: Self-pay | Admitting: *Deleted

## 2011-11-24 NOTE — Telephone Encounter (Signed)
Pt left message that her last Pap was about 1 yr ago.  She is wanting information regarding Pap smears.

## 2011-11-27 NOTE — Telephone Encounter (Signed)
Pt has made her appointment for annual exam.

## 2011-11-28 ENCOUNTER — Encounter: Payer: Self-pay | Admitting: Rehabilitative and Restorative Service Providers"

## 2011-11-30 ENCOUNTER — Ambulatory Visit: Payer: Self-pay | Admitting: Rehabilitative and Restorative Service Providers"

## 2011-12-05 ENCOUNTER — Encounter: Payer: Self-pay | Admitting: Rehabilitative and Restorative Service Providers"

## 2011-12-07 ENCOUNTER — Encounter: Payer: Self-pay | Admitting: Rehabilitative and Restorative Service Providers"

## 2011-12-18 ENCOUNTER — Encounter: Payer: Self-pay | Admitting: Physician Assistant

## 2011-12-18 ENCOUNTER — Ambulatory Visit (INDEPENDENT_AMBULATORY_CARE_PROVIDER_SITE_OTHER): Payer: Self-pay | Admitting: Family Medicine

## 2011-12-18 VITALS — BP 108/62 | HR 62 | Temp 97.3°F | Ht 65.5 in | Wt 222.8 lb

## 2011-12-18 DIAGNOSIS — L680 Hirsutism: Secondary | ICD-10-CM

## 2011-12-18 DIAGNOSIS — M549 Dorsalgia, unspecified: Secondary | ICD-10-CM | POA: Insufficient documentation

## 2011-12-18 DIAGNOSIS — Z780 Asymptomatic menopausal state: Secondary | ICD-10-CM

## 2011-12-18 DIAGNOSIS — K219 Gastro-esophageal reflux disease without esophagitis: Secondary | ICD-10-CM | POA: Insufficient documentation

## 2011-12-18 DIAGNOSIS — I10 Essential (primary) hypertension: Secondary | ICD-10-CM | POA: Insufficient documentation

## 2011-12-18 DIAGNOSIS — Z0000 Encounter for general adult medical examination without abnormal findings: Secondary | ICD-10-CM

## 2011-12-18 DIAGNOSIS — F329 Major depressive disorder, single episode, unspecified: Secondary | ICD-10-CM | POA: Insufficient documentation

## 2011-12-18 MED ORDER — SPIRONOLACTONE 50 MG PO TABS: 50.0000 mg | ORAL_TABLET | Freq: Every day | ORAL | Status: DC

## 2011-12-18 NOTE — Assessment & Plan Note (Signed)
Start spironolactone 50 mg daily

## 2011-12-18 NOTE — Progress Notes (Signed)
  Subjective:    Jannely Henthorn is a 56 y.o. female who presents for an annual exam. The patient has no complaints today. The patient is not sexually active. GYN screening history: last pap: approximate date 10/2010 and was normal. The patient wears seatbelts: yes. The patient participates in regular exercise: yes. Has the patient ever been transfused or tattooed?: yes. The patient reports that there is not domestic violence in her life.  She complains of hot flashes and night sweats.  Menstrual History: OB History    Grav Para Term Preterm Abortions TAB SAB Ect Mult Living   1 1 1  0 0 0 0 0 0 1      No LMP recorded. Patient is postmenopausal.    The following portions of the patient's history were reviewed and updated as appropriate: allergies, current medications, past family history, past medical history, past social history, past surgical history and problem list.  Review of Systems Pertinent items are noted in HPI.    Objective:    BP 108/62  Pulse 62  Temp(Src) 97.3 F (36.3 C) (Oral)  Ht 5' 5.5" (1.664 m)  Wt 222 lb 12.8 oz (101.061 kg)  BMI 36.51 kg/m2  General Appearance:    Alert, cooperative, no distress, appears stated age  Head:    Normocephalic, without obvious abnormality, atraumatic  Eyes:    PERRL, conjunctiva/corneas clear, EOM's intact, fundi    benign, both eyes  Nose:   Nares normal, septum midline, mucosa normal, no drainage    or sinus tenderness  Throat:   Lips, mucosa, and tongue normal; teeth and gums normal  Neck:   Supple, symmetrical, trachea midline, no adenopathy;    thyroid:  no enlargement/tenderness/nodules; no carotid   bruit or JVD  Back:     Symmetric, no curvature, ROM normal, no CVA tenderness  Lungs:     Clear to auscultation bilaterally, respirations unlabored  Chest Wall:    No tenderness or deformity   Heart:    Regular rate and rhythm, S1 and S2 normal, no murmur, rub   or gallop  Breast Exam:    No tenderness, masses, or nipple  abnormality  Abdomen:     Soft, non-tender, bowel sounds active all four quadrants,    no masses, no organomegaly  Genitalia:    Normal female without lesion, discharge or tenderness  Extremities:   Extremities normal, atraumatic, no cyanosis or edema  Pulses:   2+ and symmetric all extremities  Skin:   Skin color, texture, turgor normal, no rashes or lesions  Lymph nodes:   Cervical, supraclavicular, and axillary nodes normal  Neurologic:   CNII-XII intact, normal strength, sensation and reflexes    throughout  .    Assessment:    Healthy female exam. Hirsutism.  Postmenopausal   Plan:   Discussed low dose estrogen vs herbal supplementation for symptoms of menopause.  Patient opted to try herbal supplements.  Handout given. Will give spironolactone 50mg  daily to help with symptoms.   Follow up in 4 weeks.   Recheck potassium at that time as on lisinopril and HCTZ.

## 2011-12-18 NOTE — Patient Instructions (Signed)
Menopause and Herbal Products Menopause is the normal time of life when menstrual periods stop completely. Menopause is complete when you have missed 12 consecutive menstrual periods. It usually occurs between the ages of 12 to 7, with an average age of 25. Very rarely does a woman develop menopause before 54 years old. At menopause, your ovaries stop producing the female hormones, estrogen and progesterone. This can cause undesirable symptoms and also affect your health. Sometimes the symptoms can occur 4 to 5 years before the menopause begins. There is no relationship between menopause and:  Oral contraceptives.   Number of children you had.   Race.   The age your menstrual periods started (menarche).  Heavy smokers and very thin women may develop menopause earlier in life. Estrogen and progesterone hormone treatment is the usual method of treating menopausal symptoms. However, there are women who should not take hormone treatment. This is true of:   Women that have breast or uterine cancer.   Women who prefer not to take hormones because of certain side effects (abnormal uterine bleeding).   Women who are afraid that hormones may cause breast cancer.   Women who have a history of liver disease, heart disease, stroke, or blood clots.  For these women, there are other medications that may help treat their menopausal symptoms. These medications are found in plants and botanical products. They can be found in the form of herbs, teas, oils, tinctures, and pills.  CAUSES:  The ovaries stop producing the female hormones estrogen and progesterone.   Other causes include:   Surgery to remove both ovaries.   The ovaries stop functioning for no know reason.   Tumors of the pituitary gland in the brain.   Medical disease that affects the ovaries and hormone production.   Radiation treatment to the abdomen or pelvis.   Chemotherapy that affects the ovaries.   PHYTOESTROGENS: Phytoestrogen's occur naturally in plants and plant products. They act like estrogen in the body. Herbal medications are made from these plants and botanical steroids. There are 3 types of phytoestrogens:  Isoflavones (genistein and daidzein) are found in soy, garbanzo beans, miso and tofu foods.   Ligins are found in the shell of seeds. They are used to make oils like flaxseed oil. The bacteria in your intestine act on these foods to produce the estrogen-like hormones.   Coumestans are estrogen-like. Some of the foods they are found in include sunflower seeds and bean sprouts.  CONDITIONS AND THERE POSSIBLE HERBAL TREATMENT:  Hot flashes and night sweats.  1. Soy, black cohosh and evening primrose.   Irritability, insomnia, depression and memory problems.   Chasteberry, ginseng, and soy.  1. St. John's wort may be helpful for depression. However, there is a concern of it causing cataracts of the eye and may have bad effects on other medications. St. John's wort should not be taken for long time and without your caregiver's advice.   Loss of libido and vaginal and skin dryness.  1. Wild yam and soy.   Prevention of coronary heart disease and osteoporosis.  1. Soy and Isoflavones.  Several studies have shown that some women benefit from herbal medications, but most of the studies have not consistently shown that these supplements are much better than placebo. Other forms of treatment to help women with menopausal symptoms include a balanced diet, rest, exercise, vitamin and calcium (with vitamin D) supplements, acupuncture, and group therapy when necessary. THOSE WHO SHOULD NOT TAKE HERBAL MEDICATIONS INCLUDE:  Women who are planning on getting pregnant unless told by your caregiver.   Women who are breastfeeding unless told by your caregiver.   Women who are taking other prescription medications unless told by your caregiver.   Infants, children, and elderly women  unless told by your caregiver.  Different herbal medications have different and unmeasured amounts of the herbal ingredients. There are no regulations, quality control, and standardization of the ingredients in herbal medications. Therefore, the amount of the ingredient in the medication may vary from one herb, pill, tea, oil or tincture to another. Many herbal medications can cause serious problems and can even have poisonous effects if taken too much or too long. If problems develop, the medication should be stopped and recorded by your caregiver. HOME CARE INSTRUCTIONS  Do not take or give children herbal medications without your caregiver's advice.   Let your caregiver know all the medications you are taking. This includes prescription, over-the-counter, eye drops, and creams.   Do not take herbal medications longer or more than recommended.   Tell your caregiver about any side effects from the medication.  SEEK MEDICAL CARE IF:  You develop a fever of 102 F (38.9 C), or as directed by your caregiver.   You feel sick to your stomach (nauseous), vomit, or have diarrhea.   You develop a rash.   You develop abdominal pain.   You develop severe headaches.   You start to have vision problems.   You feel dizzy or faint.   You start to feel numbness in any part of your body.   You start shaking (have convulsions).  Document Released: 04/17/2008 Document Revised: 07/12/2011 Document Reviewed: 11/15/2010 [facility] Patient Information 2012 [place], [place].

## 2012-02-21 ENCOUNTER — Ambulatory Visit (HOSPITAL_COMMUNITY)
Admission: RE | Admit: 2012-02-21 | Discharge: 2012-02-21 | Disposition: A | Discharge status: Home / Self Care | Payer: Self-pay | Source: Ambulatory Visit | Attending: Family Medicine | Admitting: Family Medicine

## 2012-02-21 ENCOUNTER — Other Ambulatory Visit (HOSPITAL_COMMUNITY): Payer: Self-pay | Admitting: Family Medicine

## 2012-02-21 DIAGNOSIS — R52 Pain, unspecified: Secondary | ICD-10-CM

## 2012-02-21 DIAGNOSIS — M25559 Pain in unspecified hip: Secondary | ICD-10-CM | POA: Insufficient documentation

## 2012-02-21 DIAGNOSIS — M169 Osteoarthritis of hip, unspecified: Secondary | ICD-10-CM | POA: Insufficient documentation

## 2012-02-21 DIAGNOSIS — M1610 Unilateral primary osteoarthritis, unspecified hip: Secondary | ICD-10-CM | POA: Insufficient documentation

## 2012-04-11 ENCOUNTER — Ambulatory Visit: Payer: Self-pay | Attending: Family Medicine | Admitting: Rehabilitative and Restorative Service Providers"

## 2012-04-11 DIAGNOSIS — M25559 Pain in unspecified hip: Secondary | ICD-10-CM | POA: Insufficient documentation

## 2012-04-11 DIAGNOSIS — M545 Low back pain, unspecified: Secondary | ICD-10-CM | POA: Insufficient documentation

## 2012-04-11 DIAGNOSIS — IMO0001 Reserved for inherently not codable concepts without codable children: Secondary | ICD-10-CM | POA: Insufficient documentation

## 2012-04-16 ENCOUNTER — Ambulatory Visit: Payer: Self-pay | Attending: Family Medicine | Admitting: Physical Therapy

## 2012-04-16 DIAGNOSIS — M25559 Pain in unspecified hip: Secondary | ICD-10-CM | POA: Insufficient documentation

## 2012-04-16 DIAGNOSIS — IMO0001 Reserved for inherently not codable concepts without codable children: Secondary | ICD-10-CM | POA: Insufficient documentation

## 2012-04-16 DIAGNOSIS — M545 Low back pain, unspecified: Secondary | ICD-10-CM | POA: Insufficient documentation

## 2012-04-23 ENCOUNTER — Ambulatory Visit: Payer: Self-pay | Admitting: Rehabilitative and Restorative Service Providers"

## 2012-04-29 ENCOUNTER — Encounter: Payer: Self-pay | Admitting: Rehabilitative and Restorative Service Providers"

## 2012-05-01 ENCOUNTER — Ambulatory Visit: Payer: Self-pay | Admitting: Physical Therapy

## 2012-05-06 ENCOUNTER — Ambulatory Visit: Payer: Self-pay | Admitting: Physical Therapy

## 2012-05-08 ENCOUNTER — Encounter: Payer: Self-pay | Admitting: Physical Therapy

## 2012-05-13 ENCOUNTER — Encounter: Payer: Self-pay | Admitting: Physical Therapy

## 2012-05-15 ENCOUNTER — Encounter: Payer: Self-pay | Admitting: Rehabilitative and Restorative Service Providers"

## 2012-08-19 ENCOUNTER — Other Ambulatory Visit: Payer: Self-pay | Admitting: Obstetrics and Gynecology

## 2012-08-19 DIAGNOSIS — Z1231 Encounter for screening mammogram for malignant neoplasm of breast: Secondary | ICD-10-CM

## 2012-08-29 ENCOUNTER — Ambulatory Visit (HOSPITAL_COMMUNITY)
Admission: RE | Admit: 2012-08-29 | Discharge: 2012-08-29 | Disposition: A | Discharge status: Home / Self Care | Payer: Self-pay | Source: Ambulatory Visit | Attending: Gastroenterology | Admitting: Gastroenterology

## 2012-08-29 ENCOUNTER — Encounter (HOSPITAL_COMMUNITY): Payer: Self-pay | Admitting: *Deleted

## 2012-08-29 ENCOUNTER — Encounter (HOSPITAL_COMMUNITY)
Admission: RE | Disposition: A | Discharge status: Home / Self Care | Payer: Self-pay | Source: Ambulatory Visit | Attending: Gastroenterology

## 2012-08-29 DIAGNOSIS — Z8371 Family history of colonic polyps: Secondary | ICD-10-CM | POA: Insufficient documentation

## 2012-08-29 DIAGNOSIS — K648 Other hemorrhoids: Secondary | ICD-10-CM | POA: Insufficient documentation

## 2012-08-29 DIAGNOSIS — K5900 Constipation, unspecified: Secondary | ICD-10-CM | POA: Insufficient documentation

## 2012-08-29 DIAGNOSIS — Z83719 Family history of colon polyps, unspecified: Secondary | ICD-10-CM | POA: Insufficient documentation

## 2012-08-29 HISTORY — DX: Gastro-esophageal reflux disease without esophagitis: K21.9

## 2012-08-29 HISTORY — DX: Other intervertebral disc displacement, lumbar region: M51.26

## 2012-08-29 HISTORY — PX: COLONOSCOPY: SHX5424

## 2012-08-29 SURGERY — COLONOSCOPY
Anesthesia: Moderate Sedation

## 2012-08-29 MED ORDER — MIDAZOLAM HCL 5 MG/5ML IJ SOLN
INTRAMUSCULAR | Status: DC | PRN
  Administered 2012-08-29: 2 mg via INTRAVENOUS
  Administered 2012-08-29: 1 mg via INTRAVENOUS
  Administered 2012-08-29 (×2): 2 mg via INTRAVENOUS

## 2012-08-29 MED ORDER — MIDAZOLAM HCL 10 MG/2ML IJ SOLN
INTRAMUSCULAR | Status: AC
  Filled 2012-08-29: qty 4

## 2012-08-29 MED ORDER — DIPHENHYDRAMINE HCL 50 MG/ML IJ SOLN
INTRAMUSCULAR | Status: AC
  Filled 2012-08-29: qty 1

## 2012-08-29 MED ORDER — FENTANYL CITRATE 0.05 MG/ML IJ SOLN
INTRAMUSCULAR | Status: AC
  Filled 2012-08-29: qty 4

## 2012-08-29 MED ORDER — FENTANYL CITRATE 0.05 MG/ML IJ SOLN
INTRAMUSCULAR | Status: DC | PRN
  Administered 2012-08-29 (×4): 25 ug via INTRAVENOUS

## 2012-08-29 MED ORDER — SODIUM CHLORIDE 0.9 % IV SOLN
INTRAVENOUS | Status: DC
  Administered 2012-08-29: 09:00:00 via INTRAVENOUS

## 2012-08-29 NOTE — Op Note (Signed)
[facility] [address] [place], [address]   COLONOSCOPY PROCEDURE REPORT  PATIENT: Carolyn Wall, Carolyn Wall  MR#: [id number] BIRTHDATE: 22-Feb-1956 , 56  yrs. old GENDER: Female ENDOSCOPIST: Wandalee Ferdinand, MD REFERRED BY: PROCEDURE DATE:  08/29/2012 PROCEDURE: ASA CLASS:   2 INDICATIONS:constipation, family history of colon polyps MEDICATIONS: fentanyl 100 mcg IV, Versed 8 mg IV  DESCRIPTION OF PROCEDURE:   After the risks benefits and alternatives of the procedure were thoroughly explained, informed consent was obtained.  digital rectal exam was normal        The Pentax Ped Colon [id number]  endoscope was introduced through the anus and advanced to the cecum     . No adverse events experienced. The quality of the prep was good       The instrument was then slowly withdrawn as the colon was fully examined.    the cecum and ascending colon were normal. The transverse colon was normal. The descending colon sigmoid and rectum were normal. retroflexion in the rectum was normal with the exception of some internal hemorrhoids .   The time to cecum=  .  Withdrawal time=  . The scope was withdrawn and the procedure completed. COMPLICATIONS: There were no complications.  ENDOSCOPIC IMPRESSION: normal colonoscopy  normal colonoscopy: return to office when necessary. Repeat colonoscopy in 5 years due to family history of colon polyps.  eSigned:  Wandalee Ferdinand, MD 08/29/2012 9:46 AM   cc:

## 2012-08-29 NOTE — Progress Notes (Signed)
Patient c/o of abdominal pain with a scale of 8/10 abdomen soft, able to tolerated fluids, encouraged her to ambulate- done Dr Evette Cristal paged and called back.Dr Evette Cristal came in to check patient, prescribed pain medication, rx given to sister, patient continue to pass gas , pain is down to 2.

## 2012-08-29 NOTE — H&P (Signed)
Interval H&P for outpatient colonoscopy  History: The patient is a 56 year old female with complaints of constipation. There is a family history of colon polyps. She is here today for outpatient colonoscopy.  PE: She is in no distress, nonicteric,  Heart regular rhythm no murmurs  Lungs clear  Abdomen: Bowel sounds normal, soft, nontender  Impression: Constipation and family history of colon polyps  Plan: Outpatient colonoscopy

## 2012-08-30 ENCOUNTER — Encounter (HOSPITAL_COMMUNITY): Payer: Self-pay | Admitting: Gastroenterology

## 2012-08-30 ENCOUNTER — Encounter (HOSPITAL_COMMUNITY): Payer: Self-pay

## 2012-09-06 ENCOUNTER — Ambulatory Visit (INDEPENDENT_AMBULATORY_CARE_PROVIDER_SITE_OTHER): Payer: Self-pay | Admitting: Obstetrics and Gynecology

## 2012-09-06 ENCOUNTER — Encounter: Payer: Self-pay | Admitting: Obstetrics and Gynecology

## 2012-09-06 VITALS — BP 112/67 | HR 50 | Temp 96.6°F | Ht 65.0 in | Wt 210.5 lb

## 2012-09-06 DIAGNOSIS — Z23 Encounter for immunization: Secondary | ICD-10-CM

## 2012-09-06 DIAGNOSIS — R109 Unspecified abdominal pain: Secondary | ICD-10-CM

## 2012-09-06 DIAGNOSIS — Z01419 Encounter for gynecological examination (general) (routine) without abnormal findings: Secondary | ICD-10-CM

## 2012-09-06 DIAGNOSIS — N938 Other specified abnormal uterine and vaginal bleeding: Secondary | ICD-10-CM

## 2012-09-06 DIAGNOSIS — N949 Unspecified condition associated with female genital organs and menstrual cycle: Secondary | ICD-10-CM

## 2012-09-06 LAB — POCT URINALYSIS DIP (DEVICE)
Bilirubin Urine: NEGATIVE
Glucose, UA: NEGATIVE mg/dL
Hgb urine dipstick: NEGATIVE
Nitrite: NEGATIVE
Specific Gravity, Urine: 1.015 (ref 1.005–1.030)
pH: 7 (ref 5.0–8.0)

## 2012-09-06 MED ORDER — INFLUENZA VIRUS VACC SPLIT PF IM SUSP
0.5000 mL | Freq: Once | INTRAMUSCULAR | Status: AC
  Administered 2012-09-06: 0.5 mL via INTRAMUSCULAR

## 2012-09-06 MED ORDER — INFLUENZA VIRUS VACC SPLIT PF IM SUSP: 0.5000 mL | Freq: Once | INTRAMUSCULAR | Status: DC

## 2012-09-06 NOTE — Addendum Note (Signed)
Addended by: Franchot Mimes on: 09/06/2012 11:43 AM   Modules accepted: Orders

## 2012-09-06 NOTE — Addendum Note (Signed)
Addended by: Toula Moos on: 09/06/2012 11:40 AM   Modules accepted: Orders

## 2012-09-06 NOTE — Progress Notes (Signed)
  Subjective:    Carolyn Wall is a 56 y.o. female who presents for an annual exam.  She is concerned that she has lost her primary care provider who was at [facility] and is on antihypertensive medications. She is also having intermittent diffuse abdominal pain. She had a colonoscopy done last month. She had endometrial ablation in 2010 for AUB and fibroids. She had no bleeding episodes until 08/26/2012 when she did have some vaginal spotting. She has some urinary frequency. She is due for mammogram in December. She takes women's daily vitamin once a day. She has not been sexually active for years. GYN screening history: last Pap -2010. Denies STI's.. The patient wears seatbelts: yes. The patient participates in regular exercise: no. Has the patient ever been transfused or tattooed?: not asked. The patient reports that there is not domestic violence in her life.   Menstrual History: OB History    Grav Para Term Preterm Abortions TAB SAB Ect Mult Living   1 1 1  0 0 0 0 0 0 1      Menarche age: n/a No LMP recorded. Patient is postmenopausal.    The following portions of the patient's history were reviewed and updated as appropriate: allergies, current medications, past family history, past medical history, past social history, past surgical history and problem list.  Review of Systems     Objective:    BP 112/67  Pulse 50  Temp 96.6 F (35.9 C) (Oral)  Ht 5\' 5"  (1.651 m)  Wt 210 lb 8 oz (95.482 kg)  BMI 35.03 kg/m2 General appearance: alert, cooperative and no distress Head: Normocephalic, without obvious abnormality, atraumatic Neck: no adenopathy and thyroid not enlarged, symmetric, no tenderness/mass/nodules Lungs: clear to auscultation bilaterally Breasts: normal appearance, no masses or tenderness, Inspection negative, No nipple retraction or dimpling, Normal to palpation without dominant masses Heart: regular rate and rhythm, S1, S2 normal, no murmur, click, rub or  gallop Abdomen: Diffusely mildly tender esp in upper L>R, no guarding or rebound, obese, nondistendedvertical scar due to C-section,  laparoscopy scarPelvic: cervix normal in appearance, external genitalia normal, no adnexal masses or tenderness, no cervical motion tenderness, rectovaginal septum normal, uterus normal size, shape, and consistency, vagina normal without discharge and fair tone and support. Pap GC chlamydia done Extremities: extremities normal, atraumatic, no cyanosis or edema.    Assessment:    Healthy female exam.  Chronic hypertension. Obesity Abnormal vaginal bleeding Abdominal pain   Plan:     Thin prep Pap smear. Urinalysis.  GC/CT Mammogram will be scheduled for December Pelvic ultrasound will be scheduled due to abnormal bleeding to look at endometrial stripe Follow up in 4-6 weeks

## 2012-09-09 ENCOUNTER — Other Ambulatory Visit: Payer: Self-pay | Admitting: Obstetrics and Gynecology

## 2012-09-09 ENCOUNTER — Ambulatory Visit (HOSPITAL_COMMUNITY)
Admission: RE | Admit: 2012-09-09 | Discharge: 2012-09-09 | Disposition: A | Discharge status: Home / Self Care | Payer: Self-pay | Source: Ambulatory Visit | Attending: Obstetrics and Gynecology | Admitting: Obstetrics and Gynecology

## 2012-09-09 DIAGNOSIS — N950 Postmenopausal bleeding: Secondary | ICD-10-CM | POA: Insufficient documentation

## 2012-09-09 DIAGNOSIS — Z01419 Encounter for gynecological examination (general) (routine) without abnormal findings: Secondary | ICD-10-CM

## 2012-09-09 DIAGNOSIS — N949 Unspecified condition associated with female genital organs and menstrual cycle: Secondary | ICD-10-CM | POA: Insufficient documentation

## 2012-09-09 DIAGNOSIS — D259 Leiomyoma of uterus, unspecified: Secondary | ICD-10-CM | POA: Insufficient documentation

## 2012-09-11 ENCOUNTER — Telehealth: Payer: Self-pay | Admitting: *Deleted

## 2012-09-11 DIAGNOSIS — R109 Unspecified abdominal pain: Secondary | ICD-10-CM

## 2012-09-11 NOTE — Telephone Encounter (Signed)
Carolyn Wall called and left a message stating she wanted results from her 08/10/12 Ultrasound. Called Cielle and reviewed results with her and informed her that per chart review her provider did want her to follow up in 4-6 weeks for follow up and results. We also discussed that the radiologist reccomended endometrial biopsy because they were unable to view endometrial lining/stripe and that we would probably do that at her next visit.   Patient states hx ablation in 2010 . Carolyn Wall also states she wants something for pain which she describes as 4-5 in her lower pelvis bilaterally occuring intermittently. States can not have nsaids because takes mobic. Informed her I would send her request to her provider and we would get back to her with her providers request in the next few days. Patient voices understanding. Also transferred her to front desk to make follow up appointment

## 2012-09-12 ENCOUNTER — Other Ambulatory Visit: Payer: Self-pay | Admitting: Obstetrics and Gynecology

## 2012-09-12 MED ORDER — CYCLOBENZAPRINE HCL 5 MG PO TABS: 5.0000 mg | ORAL_TABLET | Freq: Three times a day (TID) | ORAL | Status: DC | PRN

## 2012-09-13 NOTE — Telephone Encounter (Signed)
Called patient back and informed her that flexeril had been called in to her [facility] pharmacy on [address] for her pain and that it was available for her to pick up. Patient voiced some concern about her fibroids and tests recently done. I told her her pap smear results were not back yet but to give [place] a call mid next week for the results. Patient asked about a sooner appt other than 11/25, I informed patient the best thing to do would be to call every couple days for a cancellation appt. Patient voiced understanding and had no further questions.

## 2012-09-18 ENCOUNTER — Other Ambulatory Visit: Payer: Self-pay | Admitting: *Deleted

## 2012-09-18 NOTE — Telephone Encounter (Signed)
Called Shailene and left a message we are calling with some information- and will call back or you may call us.

## 2012-09-18 NOTE — Telephone Encounter (Signed)
Message copied by Gerome Apley on Wed Sep 18, 2012  4:37 PM ------      Message from: POE, DEIRDRE C      Created: Thu Sep 12, 2012  2:59 PM       Doesn't appear that I prescribed a pain med at that last visit but based on her diagnoses will refill the Flexeril

## 2012-09-24 NOTE — Telephone Encounter (Signed)
Called and spoke w/pt. She stated that she has picked up the Flexeril that was ordered. She also stated that it is only the 5mg  tablet and does not work as well, but "takes the edge off."  She will keep next appt as scheduled on 10/07/12.

## 2012-10-07 ENCOUNTER — Ambulatory Visit (INDEPENDENT_AMBULATORY_CARE_PROVIDER_SITE_OTHER): Payer: Self-pay | Admitting: Obstetrics and Gynecology

## 2012-10-07 ENCOUNTER — Encounter: Payer: Self-pay | Admitting: Obstetrics and Gynecology

## 2012-10-07 ENCOUNTER — Other Ambulatory Visit (HOSPITAL_COMMUNITY)
Admission: RE | Admit: 2012-10-07 | Discharge: 2012-10-07 | Disposition: A | Discharge status: Home / Self Care | Payer: Self-pay | Source: Ambulatory Visit | Attending: Obstetrics and Gynecology | Admitting: Obstetrics and Gynecology

## 2012-10-07 VITALS — BP 120/70 | Temp 97.1°F | Ht 64.25 in | Wt 211.6 lb

## 2012-10-07 DIAGNOSIS — N949 Unspecified condition associated with female genital organs and menstrual cycle: Secondary | ICD-10-CM

## 2012-10-07 DIAGNOSIS — D219 Benign neoplasm of connective and other soft tissue, unspecified: Secondary | ICD-10-CM

## 2012-10-07 DIAGNOSIS — N938 Other specified abnormal uterine and vaginal bleeding: Secondary | ICD-10-CM

## 2012-10-07 DIAGNOSIS — D259 Leiomyoma of uterus, unspecified: Secondary | ICD-10-CM

## 2012-10-07 DIAGNOSIS — Z01812 Encounter for preprocedural laboratory examination: Secondary | ICD-10-CM

## 2012-10-07 NOTE — Progress Notes (Signed)
S: 56 yo G1P1 was amenorrheic since November 2010 when she had endometrial ablation for AUB and fibroids until she had spotting on 08/26/12 for less than a day. She has been having some pelvic discomfort which she rates this to do 3 and describes is nagging since about January but feels that is getting worse recently. Please see note from my previous visit.  O: Filed Vitals:   10/07/12 1505  BP: 120/70  Temp: 97.1 F (36.2 C)  TV US 09/06/12: IMPRESSION:  1. Several small uterine fibroids, largest measuring 3.4 cm.  2. Small amount of fluid noted in the endometrial cavity. Poor  visualization of the endometrial and myometrial junction, with  inability to accurately measure endometrial thickness. In the  setting postmenopausal bleeding, endometrial sampling should be  considered.  3. No adnexal mass or free fluid identified.  Original Report Authenticated By: Danae Orleans, M.D.       Patient given informed consent, signed copy in the chart, time out was performed. Appropriate time out taken. . The patient was placed in the lithotomy position and the cervix brought into view with sterile speculum.  Portio of cervix cleansed x 2 with betadine swabs.  A tenaculum was placed in the anterior lip of the cervix.  The uterus was sounded for depth of 8.5cm. A pipelle was introduced to into the uterus, suction created,  and an endometrial sample was obtained x2. All equipment was removed and accounted for.  The patient tolerated the procedure well.    Patient given post procedure instructions. The patient will return in 2 weeks for results. Danae Orleans, CNM 10/07/2012 3:55 PM

## 2012-10-31 ENCOUNTER — Telehealth: Payer: Self-pay | Admitting: *Deleted

## 2012-10-31 NOTE — Telephone Encounter (Signed)
Sanika called and left a message that she had a test 10/07/12 with D.. Poe and had not received any results.  Called Monie and discussed that she had a endometrial biopsy- discussed results were negative for malignancy  and D. Poe had made a note she needed an appointment for results.. Discussed that Miyana reports she is still having the severe pain , but no further bleeding. She states Deidre discussed possible options for treatment of the fibroids and pain were surgery- we decided she will need an appointment with a surgeon to discuss surgical options and /or preop.  Due to late hour of day will have front office call her tomorrow with an appointment - she prefers evening appt.  Also will notify Deidre be epic. Patient voices understanding of plan.

## 2012-11-04 ENCOUNTER — Ambulatory Visit (HOSPITAL_COMMUNITY)
Admission: RE | Admit: 2012-11-04 | Discharge: 2012-11-04 | Disposition: A | Discharge status: Home / Self Care | Payer: No Typology Code available for payment source | Source: Ambulatory Visit | Attending: Obstetrics and Gynecology | Admitting: Obstetrics and Gynecology

## 2012-11-04 DIAGNOSIS — Z1231 Encounter for screening mammogram for malignant neoplasm of breast: Secondary | ICD-10-CM | POA: Insufficient documentation

## 2012-11-18 ENCOUNTER — Encounter: Payer: No Typology Code available for payment source | Admitting: Obstetrics & Gynecology

## 2012-11-20 ENCOUNTER — Encounter: Payer: Self-pay | Admitting: Internal Medicine

## 2012-11-20 ENCOUNTER — Ambulatory Visit (INDEPENDENT_AMBULATORY_CARE_PROVIDER_SITE_OTHER): Payer: No Typology Code available for payment source | Admitting: Internal Medicine

## 2012-11-20 VITALS — BP 104/72 | HR 59 | Temp 97.3°F | Ht 65.0 in | Wt 216.9 lb

## 2012-11-20 DIAGNOSIS — M549 Dorsalgia, unspecified: Secondary | ICD-10-CM

## 2012-11-20 DIAGNOSIS — Z23 Encounter for immunization: Secondary | ICD-10-CM

## 2012-11-20 DIAGNOSIS — Z0000 Encounter for general adult medical examination without abnormal findings: Secondary | ICD-10-CM | POA: Insufficient documentation

## 2012-11-20 DIAGNOSIS — H04123 Dry eye syndrome of bilateral lacrimal glands: Secondary | ICD-10-CM | POA: Insufficient documentation

## 2012-11-20 DIAGNOSIS — M797 Fibromyalgia: Secondary | ICD-10-CM

## 2012-11-20 DIAGNOSIS — H04129 Dry eye syndrome of unspecified lacrimal gland: Secondary | ICD-10-CM

## 2012-11-20 DIAGNOSIS — F419 Anxiety disorder, unspecified: Secondary | ICD-10-CM

## 2012-11-20 DIAGNOSIS — I10 Essential (primary) hypertension: Secondary | ICD-10-CM

## 2012-11-20 DIAGNOSIS — F411 Generalized anxiety disorder: Secondary | ICD-10-CM

## 2012-11-20 DIAGNOSIS — IMO0001 Reserved for inherently not codable concepts without codable children: Secondary | ICD-10-CM

## 2012-11-20 LAB — CBC
HCT: 37.1 % (ref 36.0–46.0)
MCH: 29.5 pg (ref 26.0–34.0)
MCV: 85.5 fL (ref 78.0–100.0)
Platelets: 272 10*3/uL (ref 150–400)
RBC: 4.34 MIL/uL (ref 3.87–5.11)
WBC: 3.3 10*3/uL — ABNORMAL LOW (ref 4.0–10.5)

## 2012-11-20 LAB — LIPID PANEL
Cholesterol: 193 mg/dL (ref 0–200)
HDL: 48 mg/dL (ref >39)
LDL Cholesterol: 128 mg/dL — ABNORMAL HIGH (ref 0–99)
Triglycerides: 87 mg/dL (ref <150)

## 2012-11-20 LAB — COMPLETE METABOLIC PANEL WITH GFR
BUN: 17 mg/dL (ref 6–23)
CO2: 29 mEq/L (ref 19–32)
Calcium: 9.7 mg/dL (ref 8.4–10.5)
Chloride: 105 mEq/L (ref 96–112)
Creat: 0.62 mg/dL (ref 0.50–1.10)
GFR, Est African American: >89 mL/min
GFR, Est Non African American: >89 mL/min
Glucose, Bld: 86 mg/dL (ref 70–99)
Total Bilirubin: 0.7 mg/dL (ref 0.3–1.2)

## 2012-11-20 MED ORDER — CITALOPRAM HYDROBROMIDE 20 MG PO TABS: ORAL_TABLET | ORAL | Status: DC

## 2012-11-20 MED ORDER — PREGABALIN 75 MG PO CAPS: ORAL_CAPSULE | ORAL | Status: DC

## 2012-11-20 MED ORDER — LISINOPRIL-HYDROCHLOROTHIAZIDE 10-12.5 MG PO TABS: 1.0000 | ORAL_TABLET | Freq: Every day | ORAL | Status: DC

## 2012-11-20 MED ORDER — HYDROXYPROPYL METHYLCELLULOSE 2.5 % OP SOLN: 1.0000 [drp] | OPHTHALMIC | Status: DC | PRN

## 2012-11-20 NOTE — Assessment & Plan Note (Signed)
Patient's symptoms of red eyes and foreign body sensation are consistent with dry eyes. She denies migraines, blurry vision, swelling. Instructed her not to use anything that says that the red out as this can cause rebound red eyes.  -Patient agreeable to trying artificial tears 4 times a day -Patient will call if symptoms do not improve with this medication

## 2012-11-20 NOTE — Assessment & Plan Note (Signed)
Patient is up-to-date on her Pap smear, mammogram, and colonoscopy.  -Check baseline labs: FLP, CBC, CMP, HIV -Will give tetanus shot today

## 2012-11-20 NOTE — Assessment & Plan Note (Signed)
Patient with history of fibromyalgia. She is tender all over today. She was previously controlled on Lyrica, however she has not been taking this due to the lack of followup care. Therefore, we will restart Lyrica at a low dose, and titrate up from there. She states there is a program she can get his medication for free.  Patient is not a candidate for Cymbalta which would help with her chronic pain and her myalgia as well. This is due to her financial situation. Therefore, we will start Celexa. Patient and I discussed staggering the start of these medications in case of side effects.  -Prescription for Lyrica 75 mg twice a day with titrating instructions given -Celexa 20 mg daily. Instructions given to patient on titrating this medication as well

## 2012-11-20 NOTE — Assessment & Plan Note (Signed)
Patient has a history of hypertension. There is a well controlled on the central 10 mg and HCTZ 25 mg daily. Her blood pressure today is 104/72. She has been taking her medication as prescribed. She asked if we can combine the pills so she can save money and her prescriptions each month. She has had some trouble controlling her blood pressure in the past.   As her blood pressures is slightly low today, and patient would like to chart accommodation pill, we will do a trial lisinopril-HCTZ combo 10 mg/25 mg. Patient agrees to checking her blood pressure at a pharmacy in one week as she cannot afford a $20 co-pay to be seen in clinic. She states she will call with the results of this and return to clinic if it is still high. Otherwise we will see her back for a blood pressure recheck and followup of other medical issues in 4-6 weeks.  -Prescription for lisinopril HCTZ 10-25

## 2012-11-20 NOTE — Patient Instructions (Signed)
Please take medicine as prescribed and follow up in 6 weeks.  Do not start Celexa until one week after starting Lyrica.

## 2012-11-20 NOTE — Progress Notes (Signed)
Internal Medicine Clinic Visit    HPI:  Carolyn Wall is a 57 y.o. year old female with a history of or myalgia, hypertension, dysfunctional uterine bleeding status post endometrial ablation, and chronic back pain, who presents to establish care. She is a previous patient of health [facility] and has stopped taking some of her medications due to lack of refills. She has a variety of complaints today.  Patient feels like her body hurts all over from her fibromyalgia. Has these symptoms every day, cannot work or do the things she enjoys because of the pain. States she would like to work but is not able to now bc of the pain. Was taking meloxicam but it is not working. Asks about taking celebrex as she has been on this before. Was previously on Lyrica but has since stopped taking it because she hasn't seen a doctor since [facility] closed. Has a program where she can get it for free if she has a presription.   She also complains that her eyes get red occasionally. She states she feels like she has grit or sand in her eyes and that they are dry. Denies blurry vision. Has used Clear Eyes but that stopped working. She denies having epiphora, itchiness, or any other associated symptoms.  Regarding her chronic back pain, she continues to have some R sided leg pain with tingling from her previously noted disc herniation at L5, noted on previous MRI. She denies weakness, saddle anesthesia, fever chills, weight loss.  Also c/o TMJ as she is not able to open her mouth wide. Pain with opening mouth/yawning. Lots of popping in her job  She states she has a family history of "water retention." Other family history as noted in the chart. Other past mental history reviewed.   Past Medical History  Diagnosis Date  . Hypertension   . Fibromyalgia   . Depression   . Heavy menstrual bleeding     blood transfusion 1/ 2009  . Arthritis   . Lumbar herniated disc   . GERD (gastroesophageal reflux disease)     Past  Surgical History  Procedure Date  . Endometrial ablation   . Cholecystectomy   . Esophageal dilation   . Cesarean section   . Mouth surgery   . Colonoscopy 08/29/2012    Procedure: COLONOSCOPY;  Surgeon: Graylin Shiver, MD;  Location: [facility];  Service: Endoscopy;  Laterality: N/A;  dr. will bring h&p     ROS:  A complete review of systems was otherwise negative, except as noted in the HPI.  Allergies: Review of patient's allergies indicates no known allergies.  Medications: Current Outpatient Prescriptions  Medication Sig Dispense Refill  . acetaminophen (TYLENOL) 100 MG/ML solution Take 10 mg/kg by mouth every 4 (four) hours as needed.      . clonazePAM (KLONOPIN) 0.5 MG tablet Take 0.5 mg by mouth 2 (two) times daily as needed.      . cyclobenzaprine (FLEXERIL) 5 MG tablet Take 1 tablet (5 mg total) by mouth 3 (three) times daily as needed.  30 tablet  0  . diphenhydrAMINE (BENADRYL) 25 MG tablet Take 25 mg by mouth every 6 (six) hours as needed.      Marland Kitchen esomeprazole (NEXIUM) 40 MG capsule Take 40 mg by mouth daily before breakfast.      . hydrochlorothiazide (HYDRODIURIL) 25 MG tablet Take 25 mg by mouth daily.      . hydrOXYzine (VISTARIL) 25 MG capsule Take 25 mg by mouth 2 (two)  times daily.      Marland Kitchen lisinopril (PRINIVIL,ZESTRIL) 10 MG tablet Take 10 mg by mouth daily.      . meloxicam (MOBIC) 15 MG tablet Take 15 mg by mouth daily.      . naphazoline (CLEAR EYES) 0.012 % ophthalmic solution Place 1 drop into both eyes 4 (four) times daily.      . pregabalin (LYRICA) 50 MG capsule Take 50 mg by mouth daily.      Marland Kitchen spironolactone (ALDACTONE) 50 MG tablet Take 1 tablet (50 mg total) by mouth daily.  30 tablet  3    History   Social History  . Marital Status: Divorced    Spouse Name: N/A    Number of Children: N/A  . Years of Education: N/A   Occupational History  . Not on file.   Social History Main Topics  . Smoking status: Never Smoker   . Smokeless tobacco:  Never Used  . Alcohol Use: No  . Drug Use: No  . Sexually Active: No   Other Topics Concern  . Not on file   Social History Narrative  . No narrative on file    family history includes Cancer in her maternal aunt and Heart attack in her maternal grandfather and maternal grandmother.  Physical Exam Blood pressure 104/72, pulse 59, temperature 97.3 F (36.3 C), temperature source Oral, height 5\' 5"  (1.651 m), weight 216 lb 14.4 oz (98.385 kg). General:  No acute distress, alert and oriented x 3, well-appearing  HEENT:  PERRL, EOMI, no lymphadenopathy, moist mucous membranes Cardiovascular:  Regular rate and rhythm, no murmurs, rubs or gallops Respiratory:  Clear to auscultation bilaterally, no wheezes, rales, or rhonchi Abdomen:  Soft, nondistended, mild tenderness on the left, no rebound or guarding, positive bowel sounds Extremities:  Warm and well-perfused, no clubbing, cyanosis, or edema.  Skin: Warm, dry, no rashes Neuro: Not anxious appearing, no depressed mood, normal affect MSK: strength 5/5 throughout, sensation intact throughout, tender over lumbar spine. Tender palpation over shoulders, neck, knees, elbows.  Labs: Lab Results  Component Value Date   CREATININE 0.72 12/15/2010   BUN 14 12/15/2010   NA 138 12/15/2010   K 3.5 12/15/2010   CL 101 12/15/2010   CO2 28 12/15/2010   Lab Results  Component Value Date   WBC 2.9* 12/15/2010   HGB 13.0 12/15/2010   HCT 36.9 12/15/2010   MCV 85.4 12/15/2010   PLT 310 12/15/2010      Assessment and Plan:   FOLLOWUP: Carolyn Wall will follow back up in our clinic in approximately  1-2 months. Carolyn Wall knows to call out clinic in the meantime with any questions or new issues.   Patient was seen and evaluated by Denton Ar, MD,  and Aletta Edouard, MD Attending Physician.

## 2012-11-20 NOTE — Assessment & Plan Note (Signed)
Patient was previously on Klonopin, however she's been out of his medication for several weeks. We discussed using a SSRI to assist with anxiety as well as chronic pain and patient is agreeable to this plan at this time. She denies having uncontrollable panic attacks, insomnia, grandiosity, manic symptoms.  -Will start Celexa 20 mg, recent to take half a tab per day for a week then increase to full 20 mg. -Return to clinic in 4-6 weeks

## 2012-11-20 NOTE — Assessment & Plan Note (Signed)
Patient with chronic back and leg pain from herniated disc shown on previous MRI. She does not have any red flag symptoms at this time. We discussed the proper use of Flexeril for short period of time only. We will not refill this medication, however, she does have some left over from a previous prescription. We are also starting Lyrica, which can help with neuropathic pain as well. We can reevaluate her chronic symptoms at the next visit and potentially refer her to physical therapy.

## 2012-11-20 NOTE — Addendum Note (Signed)
Addended by: Maura Crandall on: 11/20/2012 03:08 PM   Modules accepted: Orders

## 2012-12-02 ENCOUNTER — Telehealth: Payer: Self-pay | Admitting: Obstetrics & Gynecology

## 2012-12-02 ENCOUNTER — Encounter: Payer: Self-pay | Admitting: Obstetrics & Gynecology

## 2012-12-02 ENCOUNTER — Ambulatory Visit (INDEPENDENT_AMBULATORY_CARE_PROVIDER_SITE_OTHER): Payer: No Typology Code available for payment source | Admitting: Obstetrics & Gynecology

## 2012-12-02 VITALS — BP 102/65 | HR 61 | Ht 65.5 in | Wt 215.3 lb

## 2012-12-02 DIAGNOSIS — Z780 Asymptomatic menopausal state: Secondary | ICD-10-CM

## 2012-12-02 DIAGNOSIS — R102 Pelvic and perineal pain: Secondary | ICD-10-CM

## 2012-12-02 DIAGNOSIS — N949 Unspecified condition associated with female genital organs and menstrual cycle: Secondary | ICD-10-CM

## 2012-12-02 NOTE — Progress Notes (Signed)
Patient ID: Carolyn Wall, female   DOB: 1956/08/23, 57 y.o.   MRN: [id number]  Chief Complaint  Patient presents with  . Surgery Consult    HPI Carolyn Wall is a 57 y.o. female.  No vaginal bleeding, last episode was 08/2012. Still with lower abdominal pain with BM, no discharge, sees a GI. HPI  Past Medical History  Diagnosis Date  . Hypertension   . Fibromyalgia   . Depression   . Heavy menstrual bleeding     blood transfusion 1/ 2009  . Arthritis   . Lumbar herniated disc   . GERD (gastroesophageal reflux disease)     Past Surgical History  Procedure Date  . Endometrial ablation   . Cholecystectomy   . Esophageal dilation   . Cesarean section   . Mouth surgery   . Colonoscopy 08/29/2012    Procedure: COLONOSCOPY;  Surgeon: Graylin Shiver, MD;  Location: [facility];  Service: Endoscopy;  Laterality: N/A;  dr. will bring h&p    Family History  Problem Relation Age of Onset  . Cancer Maternal Aunt     breast  . Heart attack Maternal Grandmother   . Heart attack Maternal Grandfather     Social History History  Substance Use Topics  . Smoking status: Never Smoker   . Smokeless tobacco: Never Used  . Alcohol Use: No    No Known Allergies  Current Outpatient Prescriptions  Medication Sig Dispense Refill  . cyclobenzaprine (FLEXERIL) 5 MG tablet Take 1 tablet (5 mg total) by mouth 3 (three) times daily as needed.  30 tablet  0  . diphenhydrAMINE (BENADRYL) 25 MG tablet Take 25 mg by mouth every 6 (six) hours as needed.      Marland Kitchen esomeprazole (NEXIUM) 40 MG capsule Take 40 mg by mouth daily before breakfast.      . lisinopril-hydrochlorothiazide (PRINZIDE,ZESTORETIC) 10-12.5 MG per tablet Take 1 tablet by mouth daily.  30 tablet  3  . acetaminophen (TYLENOL) 100 MG/ML solution Take 10 mg/kg by mouth every 4 (four) hours as needed.      . citalopram (CELEXA) 20 MG tablet Take one half tab for one week, then take one tab daily.  30 tablet  2  . pregabalin  (LYRICA) 75 MG capsule Take one capsule twice per day for the first week. Then increase to two capsules in the morning and one capsule at night for one week. Then take two capsules twice per day.  90 capsule  2    Review of Systems Review of Systems  Gastrointestinal: Positive for abdominal pain and constipation. Negative for vomiting and diarrhea.  Genitourinary: Positive for urgency and pelvic pain. Negative for dysuria, vaginal bleeding, vaginal discharge and menstrual problem.       Incontinence    Blood pressure 102/65, pulse 61, height 5' 5.5" (1.664 m), weight 215 lb 4.8 oz (97.659 kg).  Physical Exam Physical Exam  Constitutional: She appears well-developed. No distress.  Pulmonary/Chest: Effort normal.  Abdominal: She exhibits no mass. There is tenderness (suprapubic).  Skin: Skin is warm.  Psychiatric: She has a normal mood and affect. Her behavior is normal.    Data Reviewed [place], EM bx.   Assessment    Fibroids are smaller than 2010, EMBX benign    Plan    Urology referral re urinary symptoms Continue GI f/u [facility] RTC 4 months       Carolyn Wall 12/02/2012, 3:59 PM

## 2012-12-02 NOTE — Telephone Encounter (Signed)
Made urology referral to [facility], [phone] [address] [place].  Appointment 12/05/12 4:00pm with Dr. Sabino Gasser.

## 2012-12-03 NOTE — Telephone Encounter (Signed)
Called pt and left message that I am calling with information about referral appt. Please call back and leave message when you can be reached or if you would like the information to be left on your voice mail.

## 2012-12-04 NOTE — Telephone Encounter (Signed)
Called patient and left message to call us back for information about her referral and to let us know if we can leave the information on her voicemail or not

## 2012-12-04 NOTE — Telephone Encounter (Signed)
Called patient and notified of Urology Referral appt made (12/05/12 @4PM ) as stated below. Patient agrees and satisfied.

## 2013-01-06 ENCOUNTER — Encounter: Payer: Self-pay | Admitting: Internal Medicine

## 2013-01-06 ENCOUNTER — Ambulatory Visit (INDEPENDENT_AMBULATORY_CARE_PROVIDER_SITE_OTHER): Payer: No Typology Code available for payment source | Admitting: Internal Medicine

## 2013-01-06 VITALS — BP 95/62 | HR 86 | Temp 98.2°F | Ht 65.5 in | Wt 215.9 lb

## 2013-01-06 DIAGNOSIS — IMO0001 Reserved for inherently not codable concepts without codable children: Secondary | ICD-10-CM

## 2013-01-06 DIAGNOSIS — M797 Fibromyalgia: Secondary | ICD-10-CM

## 2013-01-06 DIAGNOSIS — R109 Unspecified abdominal pain: Secondary | ICD-10-CM | POA: Insufficient documentation

## 2013-01-06 MED ORDER — HYDROCHLOROTHIAZIDE 25 MG PO TABS: 25.0000 mg | ORAL_TABLET | Freq: Every day | ORAL | Status: DC

## 2013-01-06 MED ORDER — SENNA-DOCUSATE SODIUM 8.6-50 MG PO TABS: 1.0000 | ORAL_TABLET | Freq: Every day | ORAL | Status: DC

## 2013-01-06 MED ORDER — CELECOXIB 100 MG PO CAPS: 200.0000 mg | ORAL_CAPSULE | Freq: Every day | ORAL | Status: DC | PRN

## 2013-01-06 MED ORDER — AMITRIPTYLINE HCL 50 MG PO TABS: 50.0000 mg | ORAL_TABLET | Freq: Every day | ORAL | Status: DC

## 2013-01-06 NOTE — Patient Instructions (Signed)
Please return to clinic in 1 month for blood pressure check.  Please pick up medications from your pharmacy.

## 2013-01-06 NOTE — Progress Notes (Signed)
Internal Medicine Clinic Visit    HPI:  Carolyn Wall is a 57 y.o. 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  Patient states that she is hurting all over and has chronic lower back and right leg pain. She also has excruciating wrist pain. Pain is constant, every day. She has not been taking lyrica as we discussed at the last visit because it makes her "sick and nauseated." She asks about starting Celebrex which has worked for her in the past. She has been on gabapentin before and it made her sick with diarrhea and abdominal pain, but this was also around the time of her cholecystecomy so she wasn't sure if it was from that or the medication. She also stopped taking Celexa bc it made her nauseated, sick on her stomach, only took it for one week.  Carolyn Wall would also like me to document in her chart that she has had chronic L sided abdominal pain for over a year. She asks for an X ray. She states that it only lasts a few minutes then resolved and happens about 1-3 times per week. She has not noticed the pain getting worse after meals or certain foods, she has had her gall bladder removed. She does not have any diarrhea or  Hematochezia. Last colonoscopy was in October 2013 and she reports it was normal. No fever, chills.  Patient also states that she has been taking twice the dosage of her blood pressure medicines and states that her blood pressure was low when the nurse checked it today. She admits that she has been feeling "woozy" over the last week or two since she increased her dose. Worse when getting up from sitting, worse in the shower and with activity. She says she has been taking extra pills because she wanted to get rid of the swelling in her legs.    Past Medical History  Diagnosis Date  .  Hypertension   . Fibromyalgia   . Depression   . Heavy menstrual bleeding     blood transfusion 1/ 2009  . Arthritis   . Lumbar herniated disc   . GERD (gastroesophageal reflux disease)     Past Surgical History  Procedure Laterality Date  . Endometrial ablation    . Cholecystectomy    . Esophageal dilation    . Cesarean section    . Mouth surgery    . Colonoscopy  08/29/2012    Procedure: COLONOSCOPY;  Surgeon: Graylin Shiver, MD;  Location: [facility];  Service: Endoscopy;  Laterality: N/A;  dr. will bring h&p     ROS:  A complete review of systems was otherwise negative, except as noted in the HPI.  Allergies: Review of patient's allergies indicates no known allergies.  Medications: Current Outpatient Prescriptions  Medication Sig Dispense Refill  . acetaminophen (TYLENOL) 100 MG/ML solution Take 10 mg/kg by mouth every 4 (four) hours as needed.      . citalopram (CELEXA) 20 MG tablet Take one half tab for one week, then take one tab daily.  30 tablet  2  . cyclobenzaprine (FLEXERIL) 5 MG tablet Take 1 tablet (5 mg total) by mouth 3 (three) times daily as needed.  30 tablet  0  . diphenhydrAMINE (BENADRYL) 25 MG tablet Take 25 mg by mouth every 6 (six) hours as needed.      Marland Kitchen  esomeprazole (NEXIUM) 40 MG capsule Take 40 mg by mouth daily before breakfast.      . lisinopril-hydrochlorothiazide (PRINZIDE,ZESTORETIC) 10-12.5 MG per tablet Take 1 tablet by mouth daily.  30 tablet  3  . pregabalin (LYRICA) 75 MG capsule Take one capsule twice per day for the first week. Then increase to two capsules in the morning and one capsule at night for one week. Then take two capsules twice per day.  90 capsule  2   No current facility-administered medications for this visit.    History   Social History  . Marital Status: Divorced    Spouse Name: N/A    Number of Children: N/A  . Years of Education: N/A   Occupational History  . Not on file.   Social History Main Topics  .  Smoking status: Never Smoker   . Smokeless tobacco: Never Used  . Alcohol Use: No  . Drug Use: No  . Sexually Active: No   Other Topics Concern  . Not on file   Social History Narrative  . No narrative on file    family history includes Cancer in her maternal aunt and Heart attack in her maternal grandfather and maternal grandmother.  Physical Exam Blood pressure 95/62, pulse 86, temperature 98.2 F (36.8 C), temperature source Oral, height 5' 5.5" (1.664 m), weight 215 lb 14.4 oz (97.932 kg), SpO2 98.00%. General:  No acute distress, alert and oriented x 3, well-appearing AAF HEENT:  PERRL, EOMI, no lymphadenopathy, moist mucous membranes Cardiovascular:  Regular rate and rhythm, no murmurs, rubs or gallops Respiratory:  Clear to auscultation bilaterally, no wheezes, rales, or rhonchi Abdomen:  Soft, nondistended, mild tenderness on the left, no rebound or guarding, positive bowel sounds Extremities:  Warm and well-perfused, no clubbing, cyanosis, trace edema Skin: Warm, dry, no rashes Neuro: Not anxious appearing, no depressed mood, normal affect MSK: strength 5/5 throughout, sensation intact throughout, tender over lumbar spine. Tender palpation over shoulders, neck, knees, elbows. Wrists are without deformity, some pain with superficial palpation  Labs: Lab Results  Component Value Date   CREATININE 0.62 11/20/2012   BUN 17 11/20/2012   NA 141 11/20/2012   K 3.4* 11/20/2012   CL 105 11/20/2012   CO2 29 11/20/2012   Lab Results  Component Value Date   WBC 3.3* 11/20/2012   HGB 12.8 11/20/2012   HCT 37.1 11/20/2012   MCV 85.5 11/20/2012   PLT 272 11/20/2012      Assessment and Plan:   FOLLOWUP: Carolyn Wall will follow back up in our clinic in approximately  1-2 months. Carolyn Wall knows to call out clinic in the meantime with any questions or new issues.

## 2013-01-15 NOTE — Assessment & Plan Note (Signed)
Carolyn Wall was unable to tolerate Celexa or Lyrica since the last visit and has not been taking anything for her fibromyalgia for several weeks. She states that she felt "sick" on these medications and had some nausea. She states that she is in quite a lot of pain "all over" and asks about being on Celebrex. She states that she was on this medication previously and would like to try it again. She did not have any side effects.  -rx given for celebrex, take only as instruction without extra doses. Reviewed potential side effects from this medication. -we will also try starting amitryptilene for fibromyalgia/depression. Will start with 50mg  QHS and increase from there if needed. Discussed sedating effects of this medication and common side effects. Patient knows to call our office if she has questions or concerns.

## 2013-01-15 NOTE — Assessment & Plan Note (Signed)
Patient would like it noted in her chart that she has had chronic abdominal pain over the past year or so. She describes this as intermittent sharp pain that occurs sometimes daily, sometimes once per week. Lasts a few minutes then resolved. She does not notice any relationship to meals or certain activities or foods.  Discussed that an abdominal x ray would not be helpful in this case. She has had a normal colonoscopy recently and her last labs were OK. May be from constipation for which she takes PRN miralax.  Will continue to monitor this for now and try sennakot.

## 2013-01-15 NOTE — Assessment & Plan Note (Addendum)
BP Readings from Last 3 Encounters:  01/06/13 95/62  12/02/12 102/65  11/20/12 104/72    Lab Results  Component Value Date   NA 141 11/20/2012   K 3.4* 11/20/2012   CREATININE 0.62 11/20/2012    Plan:   Patient changed her medications herself after the last visit and has doubled the dose of her lisinopril HCTZ because of swelling in her legs that she wanted to get rid of. She has also felt lightheaded when standing and showering recently, which is likely explained by low blood pressure.  -d/c lisinopril -rx given for HCTZ 25mg  -discussed using foot elevation and compression stocking for venous stasis

## 2013-02-13 ENCOUNTER — Ambulatory Visit (INDEPENDENT_AMBULATORY_CARE_PROVIDER_SITE_OTHER): Payer: No Typology Code available for payment source | Admitting: Internal Medicine

## 2013-02-13 ENCOUNTER — Encounter: Payer: Self-pay | Admitting: Internal Medicine

## 2013-02-13 VITALS — BP 115/73 | HR 69 | Temp 97.7°F | Ht 65.0 in | Wt 212.2 lb

## 2013-02-13 DIAGNOSIS — M797 Fibromyalgia: Secondary | ICD-10-CM

## 2013-02-13 DIAGNOSIS — I10 Essential (primary) hypertension: Secondary | ICD-10-CM

## 2013-02-13 DIAGNOSIS — IMO0001 Reserved for inherently not codable concepts without codable children: Secondary | ICD-10-CM

## 2013-02-13 DIAGNOSIS — Z0000 Encounter for general adult medical examination without abnormal findings: Secondary | ICD-10-CM

## 2013-02-13 DIAGNOSIS — Z79899 Other long term (current) drug therapy: Secondary | ICD-10-CM

## 2013-02-13 DIAGNOSIS — M549 Dorsalgia, unspecified: Secondary | ICD-10-CM

## 2013-02-13 MED ORDER — AMITRIPTYLINE HCL 50 MG PO TABS: 50.0000 mg | ORAL_TABLET | Freq: Every day | ORAL | Status: DC

## 2013-02-13 NOTE — Assessment & Plan Note (Addendum)
BP Readings from Last 3 Encounters:  02/13/13 115/73  01/06/13 95/62  12/02/12 102/65    Lab Results  Component Value Date   NA 139 02/13/2013   K 3.2* 02/13/2013   CREATININE 0.68 02/13/2013   Well controlled BP but has been slightly low. BMP today with K 3.2. Will call patient and ask her to decrease this medication to 12.5 mg daily as well as give short course of K-dur.

## 2013-02-13 NOTE — Patient Instructions (Signed)
General Instructions: Please return to clinic in 1-2 months  Try to find an activity you enjoy and start doing moderate exercise to keep moving! We will continue to work on getting pain under control so you can enjoy getting out of the house and making friends!   Progress Toward Treatment Goals:  Treatment Goal 02/13/2013  Blood pressure at goal    Self Care Goals & Plans:  Self Care Goal 02/13/2013  Manage my medications take my medicines as prescribed; bring my medications to every visit; refill my medications on time  Monitor my health keep track of my blood pressure; bring my blood pressure log to each visit  Eat healthy foods drink diet soda or water instead of juice or soda; eat more vegetables; eat baked foods instead of fried foods; eat foods that are low in salt  Be physically active find an activity I enjoy

## 2013-02-13 NOTE — Assessment & Plan Note (Addendum)
Will try giving another prescription for amitriptyline, on $4 list at [facility]. Follow up in 1-2 months.

## 2013-02-13 NOTE — Assessment & Plan Note (Signed)
Check a1c today Encourage exercise plan and weight loss

## 2013-02-13 NOTE — Progress Notes (Signed)
Internal Medicine Clinic Visit    HPI:  Carolyn Wall is a 57 y.o. 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   Patient states that she is still having chronic pain issues, mostly her back and legs as well as both shoulders. She has not filled her Celebrex to 2 the increased cost of this medication. She thought she was able to get a discount through the company but was unable to. Patient asks about meloxicam and Ultram as well as Flexeril today. We discussed that these were not the best for long-term use. Patient also did not fill her prescription for amitriptyline that was given at the last clinic visit because it was over $4 and she was unable to afford it.  Patient also states that she does not have a lot of friends and relationships here in [place]. She does not get out of the house much but states she was like to start volunteering.  Patient denies any worsening of her pain, fever, chest pain, diarrhea.   Past Medical History  Diagnosis Date  . Hypertension   . Fibromyalgia   . Depression   . Heavy menstrual bleeding     blood transfusion 1/ 2009  . Arthritis   . Lumbar herniated disc   . GERD (gastroesophageal reflux disease)     Past Surgical History  Procedure Laterality Date  . Endometrial ablation    . Cholecystectomy    . Esophageal dilation    . Cesarean section    . Mouth surgery    . Colonoscopy  08/29/2012    Procedure: COLONOSCOPY;  Surgeon: Graylin Shiver, MD;  Location: [facility];  Service: Endoscopy;  Laterality: N/A;  dr. will bring h&p     ROS:  A complete review of systems was otherwise negative, except as noted in the HPI.  Allergies: Review of patient's allergies indicates no known allergies.  Medications: Current Outpatient Prescriptions  Medication Sig Dispense Refill   . amitriptyline (ELAVIL) 50 MG tablet Take 1 tablet (50 mg total) by mouth at bedtime.  60 tablet  5  . celecoxib (CELEBREX) 100 MG capsule Take 2 capsules (200 mg total) by mouth daily as needed for pain.  30 capsule  2  . esomeprazole (NEXIUM) 40 MG capsule Take 40 mg by mouth daily before breakfast.      . hydrochlorothiazide (HYDRODIURIL) 25 MG tablet Take 1 tablet (25 mg total) by mouth daily.  30 tablet  5  . sennosides-docusate sodium (SENOKOT-S) 8.6-50 MG tablet Take 1-2 tablets by mouth daily.       No current facility-administered medications for this visit.    History   Social History  . Marital Status: Divorced    Spouse Name: N/A    Number of Children: N/A  . Years of Education: N/A   Occupational History  . Not on file.   Social History Main Topics  . Smoking status: Never Smoker   . Smokeless tobacco: Never Used  . Alcohol Use: No  . Drug Use: No  . Sexually Active: No   Other Topics Concern  . Not on file   Social History Narrative  . No narrative on file    family history includes Cancer in her maternal aunt and Heart attack in her maternal grandfather and maternal grandmother.  Physical Exam Blood pressure 115/73, pulse 69, temperature 97.7 F (36.5 C), temperature  source Oral, height 5\' 5"  (1.651 m), weight 212 lb 3.2 oz (96.253 kg), SpO2 98.00%. General:  No acute distress, alert and oriented x 3, well-appearing AAF HEENT:  PERRL, EOMI, no lymphadenopathy, moist mucous membranes Cardiovascular:  Regular rate and rhythm, no murmurs, rubs or gallops Respiratory:  Clear to auscultation bilaterally, no wheezes, rales, or rhonchi Abdomen:  Soft, nondistended, mild tenderness on the left, no rebound or guarding, positive bowel sounds Extremities:  Warm and well-perfused, no clubbing, cyanosis, trace edema Skin: Warm, dry, no rashes Neuro: Not anxious appearing, no depressed mood, normal affect MSK: strength 5/5 throughout, sensation intact throughout,  tender over lumbar spine. Tender palpation over shoulders, neck, knees, elbows. Shoulder muscles are tender to palpation, range of motion is full, strength is intact.  Labs: Lab Results  Component Value Date   CREATININE 0.62 11/20/2012   BUN 17 11/20/2012   NA 141 11/20/2012   K 3.4* 11/20/2012   CL 105 11/20/2012   CO2 29 11/20/2012   Lab Results  Component Value Date   WBC 3.3* 11/20/2012   HGB 12.8 11/20/2012   HCT 37.1 11/20/2012   MCV 85.5 11/20/2012   PLT 272 11/20/2012      Assessment and Plan:   FOLLOWUP: Donnabelle Blanchard will follow back up in our clinic in approximately  1-2 months. Margarethe Virgen knows to call out clinic in the meantime with any questions or new issues.

## 2013-02-13 NOTE — Assessment & Plan Note (Addendum)
Stable. Will write new rx for amitriptyline. No red flag symptoms, no abnormalities on exam. Will try to avoid narcotics in this chronic condition.

## 2013-02-14 LAB — BASIC METABOLIC PANEL WITH GFR
BUN: 12 mg/dL (ref 6–23)
Chloride: 101 mEq/L (ref 96–112)
Creat: 0.68 mg/dL (ref 0.50–1.10)
Glucose, Bld: 104 mg/dL — ABNORMAL HIGH (ref 70–99)
Potassium: 3.2 mEq/L — ABNORMAL LOW (ref 3.5–5.3)

## 2013-02-17 NOTE — Progress Notes (Signed)
Case discussed with Dr. Kesty at time of visit.  We reviewed the resident's history and exam and pertinent patient test results.  I agree with the assessment, diagnosis, and plan of care documented in the resident's note. 

## 2013-02-19 MED ORDER — POTASSIUM CHLORIDE ER 10 MEQ PO TBCR: 10.0000 meq | EXTENDED_RELEASE_TABLET | Freq: Two times a day (BID) | ORAL | Status: DC

## 2013-03-20 ENCOUNTER — Encounter: Payer: No Typology Code available for payment source | Admitting: Internal Medicine

## 2013-04-01 ENCOUNTER — Ambulatory Visit (INDEPENDENT_AMBULATORY_CARE_PROVIDER_SITE_OTHER): Payer: No Typology Code available for payment source | Admitting: Internal Medicine

## 2013-04-01 ENCOUNTER — Encounter: Payer: Self-pay | Admitting: Internal Medicine

## 2013-04-01 VITALS — BP 135/83 | HR 82 | Temp 97.0°F | Ht 65.5 in | Wt 212.5 lb

## 2013-04-01 DIAGNOSIS — F419 Anxiety disorder, unspecified: Secondary | ICD-10-CM

## 2013-04-01 DIAGNOSIS — M797 Fibromyalgia: Secondary | ICD-10-CM

## 2013-04-01 DIAGNOSIS — F341 Dysthymic disorder: Secondary | ICD-10-CM

## 2013-04-01 DIAGNOSIS — I10 Essential (primary) hypertension: Secondary | ICD-10-CM

## 2013-04-01 DIAGNOSIS — IMO0001 Reserved for inherently not codable concepts without codable children: Secondary | ICD-10-CM

## 2013-04-01 LAB — C-REACTIVE PROTEIN: CRP: <0.5 mg/dL (ref <0.60)

## 2013-04-01 LAB — BASIC METABOLIC PANEL WITH GFR
BUN: 20 mg/dL (ref 6–23)
CO2: 29 mEq/L (ref 19–32)
Chloride: 103 mEq/L (ref 96–112)
Creat: 0.65 mg/dL (ref 0.50–1.10)
GFR, Est Non African American: >89 mL/min

## 2013-04-01 MED ORDER — TRAMADOL HCL 50 MG PO TABS: 50.0000 mg | ORAL_TABLET | Freq: Four times a day (QID) | ORAL | Status: DC | PRN

## 2013-04-01 MED ORDER — AMITRIPTYLINE HCL 50 MG PO TABS: 50.0000 mg | ORAL_TABLET | Freq: Every day | ORAL | Status: DC

## 2013-04-01 MED ORDER — HYDROCHLOROTHIAZIDE 12.5 MG PO TABS: 12.5000 mg | ORAL_TABLET | Freq: Every day | ORAL | Status: DC

## 2013-04-01 MED ORDER — HYDROXYZINE HCL 10 MG PO TABS: 10.0000 mg | ORAL_TABLET | Freq: Three times a day (TID) | ORAL | Status: DC | PRN

## 2013-04-01 NOTE — Progress Notes (Signed)
Patient ID: Carolyn Wall, female   DOB: 12/19/1955, 57 y.o.   MRN: [id number] Internal Medicine Clinic Visit    HPI:  Carolyn Wall is a 57 y.o. 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   She states that she has not been able to fill her prescriptions for amitriptyline because her pharmacy did not have record of that. However, I called the pharmacy myself and confirmed that he did have a prescription for amitriptyline which the patient never filled.   Patient states that she had a flare of fibromyalgia a few weeks ago, couldn't leave the house. She had pain all over her body including her arms, her neck, her back, her legs, the bottoms of her feet. She describes the pain as a key, sore, tender to patch. She had some associated fatigue and depressive thoughts. She used Tramadol which helped and she also took some leftover meloxicam from a previous prescription. Her sleep continues to be quite poor as she is in a lot of pain.  Patient is also complaining of some ear and jaw pain with some associated ache in her neck and head. She states that she has a long history of TMJ. She has tried using cough drops but that hasn't seemed to help. She states that her jaw pops frequently in her right ear is tender. She has a lot of pain when she yawns.  Checked her BP last week and it was 136/71.  Patient denies any fever, chest pain, diarrhea.   Past Medical History  Diagnosis Date  . Hypertension   . Fibromyalgia   . Depression   . Heavy menstrual bleeding     blood transfusion 1/ 2009  . Arthritis   . Lumbar herniated disc   . GERD (gastroesophageal reflux disease)     Past Surgical History  Procedure Laterality Date  . Endometrial ablation    . Cholecystectomy    . Esophageal dilation    . Cesarean section    . Mouth  surgery    . Colonoscopy  08/29/2012    Procedure: COLONOSCOPY;  Surgeon: Graylin Shiver, MD;  Location: [facility];  Service: Endoscopy;  Laterality: N/A;  dr. will bring h&p     ROS:  A complete review of systems was otherwise negative, except as noted in the HPI.  Allergies: Review of patient's allergies indicates no known allergies.  Medications: Current Outpatient Prescriptions  Medication Sig Dispense Refill  . amitriptyline (ELAVIL) 50 MG tablet Take 1 tablet (50 mg total) by mouth at bedtime.  30 tablet  5  . esomeprazole (NEXIUM) 40 MG capsule Take 40 mg by mouth daily before breakfast.      . hydrochlorothiazide (HYDRODIURIL) 25 MG tablet Take 1 tablet (25 mg total) by mouth daily.  30 tablet  5  . potassium chloride (K-DUR) 10 MEQ tablet Take 1 tablet (10 mEq total) by mouth 2 (two) times daily.  6 tablet  0  . potassium chloride (K-DUR,KLOR-CON) 10 MEQ tablet Take 20 mEq by mouth daily.      . sennosides-docusate sodium (SENOKOT-S) 8.6-50 MG tablet Take 1-2 tablets by mouth daily.       No current facility-administered medications for this visit.    History   Social History  . Marital Status: Divorced    Spouse Name: N/A    Number of Children: N/A  . Years of  Education: N/A   Occupational History  . Not on file.   Social History Main Topics  . Smoking status: Never Smoker   . Smokeless tobacco: Never Used  . Alcohol Use: No  . Drug Use: No  . Sexually Active: No   Other Topics Concern  . Not on file   Social History Narrative  . No narrative on file    family history includes Cancer in her maternal aunt and Heart attack in her maternal grandfather and maternal grandmother.  Physical Exam Blood pressure 168/101, pulse 59, temperature 97 F (36.1 C), temperature source Oral, height 5' 5.5" (1.664 m), weight 212 lb 8 oz (96.389 kg), SpO2 100.00%. General:  No acute distress, alert and oriented x 3, well-appearing AAF HEENT:  PERRL, EOMI, no  lymphadenopathy, moist mucous membranes, oropharynx is clear, patient reports pain on opening her jaw to let me look in her mouth Cardiovascular:  Regular rate and rhythm, no murmurs, rubs or gallops Respiratory:  Clear to auscultation bilaterally, no wheezes, rales, or rhonchi Abdomen:  Soft, nondistended, nontender, no rebound or guarding, positive bowel sounds Extremities:  Warm and well-perfused, no clubbing, cyanosis, no edema Skin: Warm, dry, no rashes Neuro: Not anxious appearing, no depressed mood, normal affect MSK: strength 5/5 throughout, sensation intact throughout, tender to palpation lumbar spine, neck, shoulders, arms, knees. Shoulder muscles are tender to palpation, range of motion is full, strength is intact.  Labs: Lab Results  Component Value Date   CREATININE 0.68 02/13/2013   BUN 12 02/13/2013   NA 139 02/13/2013   K 3.2* 02/13/2013   CL 101 02/13/2013   CO2 32 02/13/2013   Lab Results  Component Value Date   WBC 3.3* 11/20/2012   HGB 12.8 11/20/2012   HCT 37.1 11/20/2012   MCV 85.5 11/20/2012   PLT 272 11/20/2012      Assessment and Plan:   FOLLOWUP: Amando Ishikawa will follow back up in our clinic in 2 weeks. Loyal Rudy knows to call out clinic in the meantime with any questions or new issues.

## 2013-04-01 NOTE — Assessment & Plan Note (Signed)
Patient has still not filled her prescription for amitriptyline. I discussed with the pharmacist and he states that she never came to pick up the prescription. She does have it on hold.  -Printed off a new prescription for amitriptyline and also told patient that she has some waiting for her at the pharmacy as well -Would also consider dual therapy with SSRI as an additive. I will not start it today as patient is sensitive to medications.   -I have asked the patient to followup in 2 weeks so we can ensure that she is taking amitriptyline without any difficulties. May consider starting SSRI at that time she is stable.

## 2013-04-01 NOTE — Patient Instructions (Signed)
Please go to [facility] on [address] to pick up your prescriptions: amitriptyline -HCTZ -hydroxyzine -Ultram  Come back to clinic in 2 weeks for a blood pressure check and to go over lab work  Please try doing the jaw exercises with handouts given to treat the TMJ.  Please call us if you have any questions about medications or experience any side effects.   Progress Toward Treatment Goals:  Treatment Goal 04/01/2013  Blood pressure at goal    Self Care Goals & Plans:  Self Care Goal 04/01/2013  Manage my medications take my medicines as prescribed; bring my medications to every visit; refill my medications on time  Monitor my health keep track of my blood pressure  Eat healthy foods drink diet soda or water instead of juice or soda; eat baked foods instead of fried foods; eat foods that are low in salt  Be physically active (No Data)

## 2013-04-01 NOTE — Assessment & Plan Note (Signed)
Patient continues to have fibromyalgia pain. It is been challenging to treat this patient's pain as she does not call the clinic and inform us when she has difficulties with her medications. I think amitriptyline will be helpful as she is also having some sleeping difficulties.  -Amitriptyline 50 mg at night -As above, may consider adding SSRI to regimen at the next visit -Will also check CK, ESR, CRP, ANA as patient has not had these tests done

## 2013-04-01 NOTE — Assessment & Plan Note (Signed)
BP Readings from Last 3 Encounters:  04/01/13 135/83  02/13/13 115/73  01/06/13 95/62    Lab Results  Component Value Date   NA 139 02/13/2013   K 3.2* 02/13/2013   CREATININE 0.68 02/13/2013    Assessment: Blood pressure control: controlled Progress toward BP goal:  at goal   Plan:  Patient continues to take 25 mg of HCTZ, despite that I called her to discuss decreasing to 12.5mg . I called her pharmacy and confirmed that she has a prescription for 12.5 mg. Patient had some hypokalemia at the last visit, she did complete the course of K-dur but did not go to her followup appointment.  -Chek BMP today -Return to clinic in 2 weeks

## 2013-04-07 NOTE — Progress Notes (Signed)
INTERNAL MEDICINE TEACHING ATTENDING ADDENDUM: I discussed this case with Dr. Kesty soon after the patient visit. I have read the documentation and I agree with the plan of care. Please see the resident note for details of management. 

## 2013-04-15 ENCOUNTER — Ambulatory Visit (INDEPENDENT_AMBULATORY_CARE_PROVIDER_SITE_OTHER): Payer: No Typology Code available for payment source | Admitting: Internal Medicine

## 2013-04-15 ENCOUNTER — Encounter: Payer: Self-pay | Admitting: Internal Medicine

## 2013-04-15 VITALS — BP 138/76 | HR 71 | Temp 97.2°F | Resp 20 | Ht 65.0 in | Wt 213.8 lb

## 2013-04-15 DIAGNOSIS — IMO0001 Reserved for inherently not codable concepts without codable children: Secondary | ICD-10-CM

## 2013-04-15 DIAGNOSIS — I10 Essential (primary) hypertension: Secondary | ICD-10-CM

## 2013-04-15 DIAGNOSIS — M797 Fibromyalgia: Secondary | ICD-10-CM

## 2013-04-15 MED ORDER — FLUOXETINE HCL 20 MG PO CAPS: 20.0000 mg | ORAL_CAPSULE | Freq: Every day | ORAL | Status: DC

## 2013-04-15 MED ORDER — NAPROXEN 500 MG PO TABS: 500.0000 mg | ORAL_TABLET | Freq: Every day | ORAL | Status: DC

## 2013-04-15 NOTE — Patient Instructions (Signed)
General Instructions: For your fibromyalgia, continue taking Amitriptyline, 1 tablet daily.  We are also adding Fluoxetine (Prozac), 1 tablet daily to help with your symptoms.  For your TMJ, we are prescribing Naproxen, 1 tablet daily.  Please return for a follow-up visit in 4-6 weeks.   Treatment Goals:  Goals (1 Years of Data) as of 04/15/13   None      Progress Toward Treatment Goals:  Treatment Goal 04/15/2013  Blood pressure at goal    Self Care Goals & Plans:  Self Care Goal 04/15/2013  Manage my medications take my medicines as prescribed; bring my medications to every visit; refill my medications on time  Monitor my health -  Eat healthy foods eat baked foods instead of fried foods; eat foods that are low in salt; drink diet soda or water instead of juice or soda  Be physically active find an activity I enjoy  Other like to do sitting exercises stretching, short walks       Care Management & Community Referrals:  Referral 04/15/2013  Referrals made for care management support none needed

## 2013-04-15 NOTE — Progress Notes (Signed)
HPI The patient is a 57 y.o. female with a history of HTN, fibromyalgia, presenting for a follow-up visit.  At the patient's last visit, she was started on amitriptyline.  The patient still notes feeling "sore" all over, not significantly improved.  Since starting the medication, she notes sleeping longer than normal, and she notes symptoms of dry mouth.  The patient also notes a history of TMJ pain.  She notes that when she yawns, her jaw will have a painful "popping" sound.  Pain is a 5/10.  She has been trying some at-home stretching for this condition.  ROS: General: no fevers, chills, changes in weight, changes in appetite Skin: no rash HEENT: no blurry vision, hearing changes, sore throat Pulm: no dyspnea, coughing, wheezing CV: no chest pain, palpitations, shortness of breath Abd: see HPI GU: no dysuria, hematuria, polyuria Ext: no arthralgias, myalgias Neuro: no weakness, numbness, or tingling  Filed Vitals:   04/15/13 1537  BP: 138/76  Pulse: 71  Temp: 97.2 F (36.2 C)  Resp: 20    PEX General: alert, cooperative, and in no apparent distress HEENT: pupils equal round and reactive to light, vision grossly intact, oropharynx clear and non-erythematous, palpation of bilateral TMJ's with full mouth opening revealed a palpable "popping" sensation Neck: supple, no lymphadenopathy Lungs: clear to ascultation bilaterally, normal work of respiration, no wheezes, rales, ronchi Heart: regular rate and rhythm, no murmurs, gallops, or rubs Abdomen: soft, non-tender, non-distended, normal bowel sounds Extremities: no cyanosis, clubbing, or edema Neurologic: alert & oriented X3, cranial nerves II-XII intact, strength grossly intact, sensation intact to light touch  Current Outpatient Prescriptions on File Prior to Visit  Medication Sig Dispense Refill  . amitriptyline (ELAVIL) 50 MG tablet Take 1 tablet (50 mg total) by mouth at bedtime.  30 tablet  5  . esomeprazole (NEXIUM) 40 MG  capsule Take 40 mg by mouth daily before breakfast.      . hydrochlorothiazide (HYDRODIURIL) 12.5 MG tablet Take 1 tablet (12.5 mg total) by mouth daily.  30 tablet  6  . hydrOXYzine (ATARAX/VISTARIL) 10 MG tablet Take 1 tablet (10 mg total) by mouth every 8 (eight) hours as needed for anxiety.  30 tablet  0  . sennosides-docusate sodium (SENOKOT-S) 8.6-50 MG tablet Take 1-2 tablets by mouth daily.      . traMADol (ULTRAM) 50 MG tablet Take 1 tablet (50 mg total) by mouth every 6 (six) hours as needed for pain.  20 tablet  0   No current facility-administered medications on file prior to visit.    Assessment/Plan

## 2013-04-15 NOTE — Assessment & Plan Note (Addendum)
The patient presents for follow-up of fibromyalgia.  Symptoms have not significantly improved, and the patient notes side effects of somnolence and dry mouth.  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 -will add fluoxetine today -patient instructed to discontinue hydroxyzine, which, in combination with amitriptyline, can be causing the patient's side effects -patient to follow-up in 4-6 weeks -will try a 2-week course of naproxen 500 mg daily for treatment of possible TMJ

## 2013-04-15 NOTE — Assessment & Plan Note (Signed)
BP Readings from Last 3 Encounters:  04/15/13 138/76  04/01/13 135/83  02/13/13 115/73    Lab Results  Component Value Date   NA 143 04/01/2013   K 3.8 04/01/2013   CREATININE 0.65 04/01/2013    Assessment: Blood pressure control: controlled Progress toward BP goal:  at goal Comments: Well-controlled  Plan: Medications:  continue current medications Educational resources provided: brochure Self management tools provided: home blood pressure logbook Other plans: Recheck BP at next visit

## 2013-04-24 ENCOUNTER — Ambulatory Visit: Payer: No Typology Code available for payment source

## 2013-06-13 ENCOUNTER — Encounter: Payer: No Typology Code available for payment source | Admitting: Internal Medicine

## 2013-06-27 ENCOUNTER — Encounter: Payer: Self-pay | Admitting: Internal Medicine

## 2013-06-27 ENCOUNTER — Ambulatory Visit (INDEPENDENT_AMBULATORY_CARE_PROVIDER_SITE_OTHER): Payer: No Typology Code available for payment source | Admitting: Internal Medicine

## 2013-06-27 VITALS — BP 138/77 | HR 59 | Temp 97.7°F | Ht 65.0 in | Wt 214.4 lb

## 2013-06-27 DIAGNOSIS — M797 Fibromyalgia: Secondary | ICD-10-CM

## 2013-06-27 DIAGNOSIS — IMO0001 Reserved for inherently not codable concepts without codable children: Secondary | ICD-10-CM

## 2013-06-27 DIAGNOSIS — I10 Essential (primary) hypertension: Secondary | ICD-10-CM

## 2013-06-27 MED ORDER — CYCLOBENZAPRINE HCL 10 MG PO TABS: 10.0000 mg | ORAL_TABLET | Freq: Three times a day (TID) | ORAL | Status: DC | PRN

## 2013-06-27 MED ORDER — FLUOXETINE HCL 10 MG PO CAPS: 10.0000 mg | ORAL_CAPSULE | Freq: Every day | ORAL | Status: DC

## 2013-06-27 MED ORDER — MELOXICAM 7.5 MG PO TABS: 15.0000 mg | ORAL_TABLET | Freq: Every day | ORAL | Status: DC

## 2013-06-27 NOTE — Patient Instructions (Addendum)
-  Continue taking Amitriptyline 50 mg daily  -Take Prozac 10mg  for 1 week, then 10mg  every other day for 1 week,  then stop  -Take Flexiril 10 mg up to 3 times daily to relieve muscle aches and pains -Take meloxicam 10mg  daily as needed for pain, take with food to prevent stomach upset -Continue taking hydrochlorothiazide 12.5mg  for your high blood pressure -Please keep a log of your blood pressure and bring it with you to the next visit -We check your electrolytes today, we will tell you your results next time  -We will see you back in 1 month

## 2013-06-28 LAB — BASIC METABOLIC PANEL WITH GFR
BUN: 15 mg/dL (ref 6–23)
CO2: 26 mEq/L (ref 19–32)
GFR, Est African American: >89 mL/min
Glucose, Bld: 117 mg/dL — ABNORMAL HIGH (ref 70–99)
Potassium: 3.5 mEq/L (ref 3.5–5.3)

## 2013-06-28 NOTE — Progress Notes (Signed)
Patient ID: Carolyn Wall, female   DOB: 09-14-1956, 57 y.o.   MRN: [id number]   Subjective:   Patient ID: Carolyn Wall female   DOB: June 18, 1956 57 y.o.   MRN: [id number]  HPI: [name] is a 57 y.o. African American female with pmh of HTN, GERD, fibromyalgia, anxiety, depression, lumbar herniated disc, and osteoarthritis who presents for follow-up visit. At her last visit prozac was added to her medications for her fibromyalgia and naproxen for TMJ syndrome. She reports no improvement in her symptoms. She reports muscle soreness and aches all over her body, with joint stiffness worse in in the morning that lasts for couple of hours, sometimes getting better during the day. At times she has  swollen and painful joints, specially in her hands. Her bones and joints are painful with clicking and popping of jaw and knees. She is not about to open her mouth wide. No history of grinding her teeth at night. She has not seen a specialist for TMJ syndrome. The naproxen she was prescribed at last visit lasted for 1 week or so and did not alleviate the pain.   She reports her sleep is somewhat improved. She still is complaining of fatigue. She is not able to do much aerboic exercise due to lower back pain from lumbar herniated disc. She has tried acupuncture in the past with no success. She was previously on gabapentin but did not tolerate it well.        At last visit her blood pressure medication was changed. She currently takes HCTZ 12.5mg  daily. Her blood pressure at home is in the 130's SBP.      Past Medical History  Diagnosis Date  . Hypertension   . Fibromyalgia   . Depression   . Heavy menstrual bleeding     blood transfusion 1/ 2009  . Arthritis   . Lumbar herniated disc   . GERD (gastroesophageal reflux disease)    Current Outpatient Prescriptions  Medication Sig Dispense Refill  . amitriptyline (ELAVIL) 50 MG tablet Take 1 tablet (50 mg total) by mouth at bedtime.  30 tablet   5  . cyclobenzaprine (FLEXERIL) 10 MG tablet Take 1 tablet (10 mg total) by mouth 3 (three) times daily as needed for muscle spasms.  90 tablet  0  . esomeprazole (NEXIUM) 40 MG capsule Take 40 mg by mouth daily before breakfast.      . FLUoxetine (PROZAC) 10 MG capsule Take 1 capsule (10 mg total) by mouth daily.  11 capsule  0  . hydrochlorothiazide (HYDRODIURIL) 12.5 MG tablet Take 1 tablet (12.5 mg total) by mouth daily.  30 tablet  6  . meloxicam (MOBIC) 7.5 MG tablet Take 2 tablets (15 mg total) by mouth daily.  60 tablet  0  . sennosides-docusate sodium (SENOKOT-S) 8.6-50 MG tablet Take 1-2 tablets by mouth daily.       No current facility-administered medications for this visit.   Family History  Problem Relation Age of Onset  . Cancer Maternal Aunt     breast  . Heart attack Maternal Grandmother   . Heart attack Maternal Grandfather    History   Social History  . Marital Status: Divorced    Spouse Name: N/A    Number of Children: N/A  . Years of Education: N/A   Social History Main Topics  . Smoking status: Never Smoker   . Smokeless tobacco: Never Used  . Alcohol Use: No  . Drug Use: No  . Sexual Activity:  No   Other Topics Concern  . None   Social History Narrative  . None   Review of Systems: Review of Systems  Constitutional: Positive for malaise/fatigue. Negative for fever.  HENT:       Trismus   Eyes: Negative for blurred vision.  Respiratory: Positive for shortness of breath (when anxxious). Negative for cough.   Cardiovascular: Positive for palpitations (when anxious). Negative for chest pain and leg swelling.  Gastrointestinal: Positive for heartburn and abdominal pain (LLQ pain occasionally). Negative for nausea, vomiting, diarrhea and constipation.  Genitourinary: Negative for dysuria.  Musculoskeletal: Positive for myalgias, back pain and joint pain.  Skin: Negative for rash.  Neurological: Negative for dizziness.  Psychiatric/Behavioral:  Positive for depression.    Objective:  Physical Exam: Filed Vitals:   06/27/13 1544  BP: 138/77  Pulse: 59  Temp: 97.7 F (36.5 C)  TempSrc: Oral  Height: 5\' 5"  (1.651 m)  Weight: 214 lb 6.4 oz (97.251 kg)  SpO2: 99%   Physical Exam  Constitutional: She is oriented to person, place, and time. She appears well-developed and well-nourished. No distress.  Obese   HENT:  Head: Normocephalic and atraumatic.  Eyes: Conjunctivae and EOM are normal. Pupils are equal, round, and reactive to light. Right eye exhibits no discharge. Left eye exhibits no discharge. No scleral icterus.  Neck: Normal range of motion. Neck supple.  Cardiovascular: Normal rate, regular rhythm and normal heart sounds.   No murmur heard. Pulmonary/Chest: Effort normal and breath sounds normal. No respiratory distress. She has no wheezes. She has no rales. She exhibits no tenderness.  Abdominal: Soft. Bowel sounds are normal. She exhibits no distension. There is no tenderness. There is no rebound.  Musculoskeletal: She exhibits tenderness. She exhibits no edema.  Decreased ROM shoulder bilaterally   Decreased ROM of knee bilaterally, cracking sounds with passive movement MCP  joints bilaterally with tenderness, no swelling or warmth  Neurological: She is alert and oriented to person, place, and time.  Normal muscle strength   Skin: Skin is warm and dry. No rash noted. She is not diaphoretic.  Psychiatric: She has a normal mood and affect. Her behavior is normal.   Assessment & Plan:  Please see problem list for problem based assessment and plan.

## 2013-06-28 NOTE — Assessment & Plan Note (Addendum)
Assessment: Patient with controlled blood pressure, today 138/77, reports at home mid 130's SBP. Currently on HCTZ 12.5mg  daily.  Plan:  -Continue HCTZ 12.5mg  daily -Check BMP today -To keep blood pressure log at home and bring to next visit

## 2013-06-28 NOTE — Assessment & Plan Note (Addendum)
Assessment: Patient with diffuse muscle aches and joint pain.  Previous imaging in 2012 and 2013 showing mild to moderate acromioclavicular osteoarthritis, mild right greater than left hip osteoarthritis, facet degenerative disease in the lower lumbar spine, and reduced anterior translation of the right mandibular condyle compared to the left. Work-up in past has included normal TSH, HBA1c, HIV,  ESR, CRP, CK, ANA.  Reports being diagnosed with fibromyalgia by previous physician. Differential includes endocrine disorder (hyperparathyroidism, hypoparathyroidism, Cushing's) , autoimmune disorder (polymaglia rheumatica, RA, SLE, polymyositis), infection (hep C, Lyme's disease, parvovirus), malignancy, neurological disorders (MS, sleep apnea), psychiatric disorder, and vitamin D deficiency. At this time no indication for BMD test- she is postmenopausal with no history of fractures, non-smoker, not on thyroid replacement, hormone replacement, or corticosteroid  therapy and no significant FH of osteoporosis. At time this will treat for fibromyalgia with low-dose TCA and muscle relaxant.  Will wean off SSRI and consider cymbalta (SNRI) at next visit. NSAID (normal renal function) therapy for osteoarthritis.    Plan:  -Check Vitamin D (1,25 hydroxy), Calcium, and intact PTH levels  -Will slowly taper prozac-10mg  for 1 week, then 10mg  every other day for 1 week,  then stop -Continue taking Amitriptyline 50 mg daily  -Take Flexiril 10 mg up to 3 times daily to relieve muscle aches and pains -Take meloxicam 10mg  daily as needed for pain, take with food to prevent stomach upset -Recommeneded aerobic exercise, start low and go slow - Follow-up visit in 1 month

## 2013-06-30 NOTE — Progress Notes (Signed)
I saw and evaluated the patient.  I personally confirmed the key portions of the history and exam documented by Dr. Rabbani and I reviewed pertinent patient test results.  The assessment, diagnosis, and plan were formulated together and I agree with the documentation in the resident's note.  

## 2013-08-14 ENCOUNTER — Other Ambulatory Visit: Payer: Self-pay | Admitting: Internal Medicine

## 2013-08-14 DIAGNOSIS — M2550 Pain in unspecified joint: Secondary | ICD-10-CM

## 2013-08-14 NOTE — Addendum Note (Signed)
Addended byOtis Brace on: 08/14/2013 04:53 PM   Modules accepted: Orders

## 2013-08-15 ENCOUNTER — Encounter: Payer: Self-pay | Admitting: Internal Medicine

## 2013-08-15 ENCOUNTER — Ambulatory Visit (INDEPENDENT_AMBULATORY_CARE_PROVIDER_SITE_OTHER): Payer: No Typology Code available for payment source | Admitting: Internal Medicine

## 2013-08-15 VITALS — BP 139/76 | HR 62 | Temp 98.8°F | Ht 65.0 in | Wt 213.9 lb

## 2013-08-15 DIAGNOSIS — IMO0001 Reserved for inherently not codable concepts without codable children: Secondary | ICD-10-CM

## 2013-08-15 DIAGNOSIS — I10 Essential (primary) hypertension: Secondary | ICD-10-CM

## 2013-08-15 DIAGNOSIS — M797 Fibromyalgia: Secondary | ICD-10-CM

## 2013-08-15 MED ORDER — AMITRIPTYLINE HCL 25 MG PO TABS: 25.0000 mg | ORAL_TABLET | Freq: Every day | ORAL | Status: DC

## 2013-08-15 MED ORDER — CYCLOBENZAPRINE HCL 10 MG PO TABS: 15.0000 mg | ORAL_TABLET | Freq: Two times a day (BID) | ORAL | Status: DC | PRN

## 2013-08-15 NOTE — Assessment & Plan Note (Signed)
Patient has been taking Flexeril only 10 mg each bedtime as opposed to 10 mg twice a day. She has also been off her amitriptyline due to increased drowsiness. She reports some marginal relief from the Mobic 15 mg daily. She tells me that she is planning to have approval of Celebrex, which has been more effective for her in the past.  Plan -There are no quick answers in management of her multiple and overlapping symptoms related to fibromyalgia. I recommended to reduce her dose of amitriptyline to 25 mg at bedtime to see if this will mitigate the side effects and and hopefully provide some relief of her symptoms. Basically we are just giving amitriptyline a trial. Obviously, if she continues to be intolerant to this dose of amitriptyline, then we can switch her to Cymbalta , which is another option for treatment of fibromyalgia. - I have encouraged her to take Flexeril 30 mg/day in 2-3 divided doses keeping in mind it's side effects of drowsiness. I spent some time cautioning her about the likely side effects of this medication especially if used with amitriptyline. She verbalized understanding. - Continue with Mobic 15 mg daily with meals.  - If Celebrex is approved, we can use it instead of Mobic - I will see her again in 2 weeks at which time other options can be explored - including Cymbalta.

## 2013-08-15 NOTE — Patient Instructions (Signed)
Please take a reduced dose of Amitriptylline 25 mg at bedtime and see if you will be able to tolerate it. If it does not work for you, we will consider another option. Please take Flexeril at 15 mg at bedtime  Take the rest of your medications as before  We can switch to Cerebrex once it is approved for you.  Please come back after 2 weeks for recheck

## 2013-08-15 NOTE — Assessment & Plan Note (Signed)
BP okay today. Continue with current dose of HCTZ 12.5 mg daily.

## 2013-08-15 NOTE — Progress Notes (Signed)
Patient ID: Carolyn Wall, female   DOB: 02/10/1956, 57 y.o.   MRN: [id number]   Subjective:   HPI: [name] is a 57 y.o. with past medical history of fibromyalgia, anxiety, and depression, hypertension, and GERD presents to the clinic for followup visit.  Patient reports that her pains have not improved since being on her new prescription of Mobic and Flexeril in addition to Amtriptylline. She reports, that she's been unable to take her amitriptyline 50 mg at bedtime due to increased drowsiness. The last time she took Amitriptylline was 08/10/2013. However, she requests for an increase in dorsal of her Flexeril. She reports that she was taking 20 mg each bedtime given the medication was prescribed as 10 mg twice a day.  Kindly see the A&P for the status of the pt's chronic medical problems.   Past Medical History  Diagnosis Date  . Hypertension   . Fibromyalgia   . Depression   . Heavy menstrual bleeding     blood transfusion 1/ 2009  . Arthritis   . Lumbar herniated disc   . GERD (gastroesophageal reflux disease)    Current Outpatient Prescriptions  Medication Sig Dispense Refill  . amitriptyline (ELAVIL) 25 MG tablet Take 1 tablet (25 mg total) by mouth at bedtime.  30 tablet  5  . cyclobenzaprine (FLEXERIL) 10 MG tablet Take 1.5 tablets (15 mg total) by mouth 2 (two) times daily as needed for muscle spasms.  90 tablet  0  . esomeprazole (NEXIUM) 40 MG capsule Take 40 mg by mouth daily before breakfast.      . hydrochlorothiazide (HYDRODIURIL) 12.5 MG tablet Take 1 tablet (12.5 mg total) by mouth daily.  30 tablet  6  . meloxicam (MOBIC) 7.5 MG tablet Take 2 tablets (15 mg total) by mouth daily.  60 tablet  0   No current facility-administered medications for this visit.   Family History  Problem Relation Age of Onset  . Cancer Maternal Aunt     breast  . Heart attack Maternal Grandmother   . Heart attack Maternal Grandfather    History   Social History  .  Marital Status: Divorced    Spouse Name: N/A    Number of Children: N/A  . Years of Education: N/A   Social History Main Topics  . Smoking status: Never Smoker   . Smokeless tobacco: Never Used  . Alcohol Use: No  . Drug Use: No  . Sexual Activity: No   Other Topics Concern  . None   Social History Narrative  . None   Review of Systems: Constitutional: Denies fever, chills, diaphoresis, appetite change and fatigue.  Respiratory: Denies SOB, DOE, cough, chest tightness, and wheezing. Denies chest pain. Cardiovascular: No chest pain, palpitations and leg swelling.  Gastrointestinal: No abdominal pain, nausea, vomiting, bloody stools Genitourinary: No dysuria, frequency, hematuria, or flank pain.  Musculoskeletal: pains in multiple place ? Fibromyalgia Psych: reports anxiety symptoms from time to time.    Objective:  Physical Exam: Filed Vitals:   08/15/13 1513  BP: 139/76  Pulse: 62  Temp: 98.8 F (37.1 C)  TempSrc: Oral  Height: 5\' 5"  (1.651 m)  Weight: 213 lb 14.4 oz (97.024 kg)  SpO2: 99%   General: Well nourished. No acute distress.  HEENT: Normal oral mucosa. MMM.  Lungs: CTA bilaterally. Heart: RRR; no extra sounds or murmurs  Abdomen: Non-distended, normal BS, soft, nontender; no hepatosplenomegaly  Extremities: No pedal edema. Multiple tender points consistent with Fibromyalgia. Neurologic: Normal EOM,  Alert and oriented x3. No obvious neurologic/cranial nerve deficits.  Assessment & Plan:  I have discussed my assessment and plan  with  my attending in the clinic, Dr. Dalphine Handing  as detailed under problem based charting.

## 2013-08-18 NOTE — Progress Notes (Signed)
Case discussed with Dr.Kazibwe at the time of the visit.  We reviewed the resident's history and exam and pertinent patient test results.  I agree with the assessment, diagnosis, and plan of care documented in the resident's note.    

## 2013-08-29 ENCOUNTER — Ambulatory Visit: Payer: No Typology Code available for payment source | Admitting: Internal Medicine

## 2013-09-11 ENCOUNTER — Ambulatory Visit (INDEPENDENT_AMBULATORY_CARE_PROVIDER_SITE_OTHER): Payer: No Typology Code available for payment source | Admitting: Internal Medicine

## 2013-09-11 ENCOUNTER — Encounter: Payer: Self-pay | Admitting: Internal Medicine

## 2013-09-11 VITALS — BP 138/83 | HR 70 | Temp 97.1°F | Wt 215.9 lb

## 2013-09-11 DIAGNOSIS — IMO0001 Reserved for inherently not codable concepts without codable children: Secondary | ICD-10-CM

## 2013-09-11 DIAGNOSIS — I10 Essential (primary) hypertension: Secondary | ICD-10-CM

## 2013-09-11 DIAGNOSIS — M797 Fibromyalgia: Secondary | ICD-10-CM

## 2013-09-11 DIAGNOSIS — Z23 Encounter for immunization: Secondary | ICD-10-CM

## 2013-09-11 MED ORDER — CELECOXIB 100 MG PO CAPS: 100.0000 mg | ORAL_CAPSULE | Freq: Two times a day (BID) | ORAL | Status: DC

## 2013-09-11 MED ORDER — MELOXICAM 7.5 MG PO TABS: 15.0000 mg | ORAL_TABLET | Freq: Every day | ORAL | Status: DC

## 2013-09-11 MED ORDER — DULOXETINE HCL 30 MG PO CPEP: 60.0000 mg | ORAL_CAPSULE | Freq: Every day | ORAL | Status: DC

## 2013-09-11 NOTE — Assessment & Plan Note (Signed)
BP well controlled with HCTZ 12.5 mg daily. Will continue.

## 2013-09-11 NOTE — Assessment & Plan Note (Addendum)
Patient is now taking Flexeril 15 mg each bedtime and amitriptyline 25 mg at bedtime since her last visit. She still continues to report increased drowsiness and sleepiness, which might be a S/E of Flexeril and amitriptyline. She continuee to have marginal relief from the Mobic 15 mg daily but she is interested in continuing it until we can get Celebrex, which is her preference.   Plan - Again,  there are no quick answers in management of her multiple and overlapping pain symptoms related to fibromyalgia. I believe she has failed her trial of amitriptyline due to side effects and suboptimal response. I will switch her to Cymbalta today, which is another option for treatment of fibromyalgia. She is getting this through the [facility] program, which can take up to 6 weeks. In the interim she can continue with amitriptyline and switch to cymbalta once she gets it. She will start at 30 mg daily for one week and escalate to 60 mg daily. She verbalized understanding of these instructions. - I have encouraged her to take Flexeril 30 mg/day in 2-3 divided doses keeping in mind its side effects of drowsiness. I spent some time cautioning her about the likely side effects of this medication especially if used with amitriptyline. She verbalized understanding. - Will switch Mobic to celecoxib as soon as we get through [facility] - Discussed with Dr. Meredith Pel about the possibility of referral to pain clinic, which the patient is interested in. However, I am in favor of trying Cymbalta and Celecoxib before referral to the pain clinic if the response is unsatisfactory. - Followup with the PCP.

## 2013-09-11 NOTE — Progress Notes (Signed)
Patient ID: Carolyn Wall, female   DOB: 12-Feb-1956, 57 y.o.   MRN: [id number]   Subjective:   HPI: [name] is a 57 y.o. with past medical history of fibromyalgia, anxiety, and depression, hypertension, and GERD presents to the clinic for followup visit for her fibromyalgia pain.  Last office visit was 08/15/2013 with me where I reduced her amitriptyline 25 mg (due to side effects) at bedtime and continued Mobic 15 mg daily in addition to Flexeril 30 mg per day. She continues to report persistent pains. She reports that she's been compliant with her medication except Mobic due to some confusion in instructions. She is trying to get approval for Celebrex from [employer] and she presents with application forms for me to complete. Fortunately, this medication together with Cymbalta can be provided through the [facility] ([facility]), though this can take up to 6 weeks to come through.   Kindly see the A&P for the status of the pt's chronic medical problems.   Past Medical History  Diagnosis Date  . Hypertension   . Fibromyalgia   . Depression   . Heavy menstrual bleeding     blood transfusion 1/ 2009  . Arthritis   . Lumbar herniated disc   . GERD (gastroesophageal reflux disease)    Current Outpatient Prescriptions  Medication Sig Dispense Refill  . celecoxib (CELEBREX) 100 MG capsule Take 1 capsule (100 mg total) by mouth 2 (two) times daily.  60 capsule  3  . cyclobenzaprine (FLEXERIL) 10 MG tablet Take 1.5 tablets (15 mg total) by mouth 2 (two) times daily as needed for muscle spasms.  90 tablet  0  . DULoxetine (CYMBALTA) 30 MG capsule Take 2 capsules (60 mg total) by mouth daily.  30 capsule  3  . esomeprazole (NEXIUM) 40 MG capsule Take 40 mg by mouth daily before breakfast.      . hydrochlorothiazide (HYDRODIURIL) 12.5 MG tablet Take 1 tablet (12.5 mg total) by mouth daily.  30 tablet  6   No current facility-administered medications for this visit.    Family History  Problem Relation Age of Onset  . Cancer Maternal Aunt     breast  . Heart attack Maternal Grandmother   . Heart attack Maternal Grandfather    History   Social History  . Marital Status: Divorced    Spouse Name: N/A    Number of Children: N/A  . Years of Education: N/A   Social History Main Topics  . Smoking status: Never Smoker   . Smokeless tobacco: Never Used  . Alcohol Use: No  . Drug Use: No  . Sexual Activity: No   Other Topics Concern  . None   Social History Narrative  . None   Review of Systems: Constitutional: Denies fever, chills, diaphoresis, appetite change and fatigue.  Respiratory: Denies SOB, DOE, cough, chest tightness, and wheezing. Denies chest pain. Cardiovascular: No chest pain, palpitations and leg swelling.  Gastrointestinal: No abdominal pain, nausea, vomiting, bloody stools Genitourinary: No dysuria, frequency, hematuria, or flank pain.  Musculoskeletal: pains in multiple place ? Fibromyalgia Psych: reports anxiety symptoms from time to time.    Objective:  Physical Exam: Filed Vitals:   09/11/13 1534  BP: 138/83  Pulse: 70  Temp: 97.1 F (36.2 C)  TempSrc: Oral  Weight: 215 lb 14.4 oz (97.932 kg)  SpO2: 100%   General: Well nourished. No acute distress.  HEENT: Normal oral mucosa. MMM.  Lungs: CTA bilaterally. Heart: RRR; no extra sounds or  murmurs  Abdomen: Non-distended, normal BS, soft, nontender; no hepatosplenomegaly  Extremities: No pedal edema. Multiple tender points consistent with Fibromyalgia. Neurologic: Normal EOM,  Alert and oriented x3. No obvious neurologic/cranial nerve deficits.  Assessment & Plan:  I have discussed my assessment and plan  with  my attending in the clinic, Dr. Meredith Pel as detailed under problem based charting.

## 2013-09-11 NOTE — Patient Instructions (Signed)
We will continue with Amitriptylline 25 mg at bedtime  We are going to start working of Cymbalta through the health department You might also be able to get cerebrex from there too and We can switch Mobic to Cerebrex once it is approved for you. Please take Flexeril at 15 mg at bedtime  Take the rest of your medications as before  Follow up with you regular doctor in 2 month

## 2013-09-30 ENCOUNTER — Other Ambulatory Visit: Payer: Self-pay | Admitting: Internal Medicine

## 2013-09-30 DIAGNOSIS — Z1231 Encounter for screening mammogram for malignant neoplasm of breast: Secondary | ICD-10-CM

## 2013-10-03 ENCOUNTER — Ambulatory Visit: Payer: No Typology Code available for payment source

## 2013-10-07 NOTE — Progress Notes (Signed)
Case discussed with Dr. Kazibwe soon after the resident saw the patient.  We reviewed the resident's history and exam and pertinent patient test results.  I agree with the assessment, diagnosis, and plan of care documented in the resident's note. 

## 2013-10-20 ENCOUNTER — Encounter: Payer: Self-pay | Admitting: Internal Medicine

## 2013-10-20 ENCOUNTER — Ambulatory Visit (INDEPENDENT_AMBULATORY_CARE_PROVIDER_SITE_OTHER): Payer: No Typology Code available for payment source | Admitting: Internal Medicine

## 2013-10-20 VITALS — BP 146/80 | HR 62 | Temp 97.0°F | Ht 65.5 in | Wt 217.8 lb

## 2013-10-20 DIAGNOSIS — M797 Fibromyalgia: Secondary | ICD-10-CM

## 2013-10-20 DIAGNOSIS — H538 Other visual disturbances: Secondary | ICD-10-CM

## 2013-10-20 DIAGNOSIS — Z0000 Encounter for general adult medical examination without abnormal findings: Secondary | ICD-10-CM

## 2013-10-20 DIAGNOSIS — IMO0001 Reserved for inherently not codable concepts without codable children: Secondary | ICD-10-CM

## 2013-10-20 DIAGNOSIS — M2550 Pain in unspecified joint: Secondary | ICD-10-CM

## 2013-10-20 DIAGNOSIS — K219 Gastro-esophageal reflux disease without esophagitis: Secondary | ICD-10-CM

## 2013-10-20 DIAGNOSIS — I10 Essential (primary) hypertension: Secondary | ICD-10-CM

## 2013-10-20 NOTE — Progress Notes (Signed)
Patient ID: Carolyn Wall, female   DOB: September 26, 1956, 57 y.o.   MRN: [id number]   Subjective:   Patient ID: Carolyn Wall female   DOB: 18-Nov-1955 57 y.o.   MRN: [id number]  HPI: [name] is a 57 y.o. pleasant woman with past medical history of hypertension, anxiety, depression, fibromyalgia, lumbar herniated disc, osteoarthritis, and GERD who presents for follow-up visit of fibromyalgia.   Patient reports that her pain that involves her whole body down to her feet has not improved on current medications which includes amitriptyline 25 mg daily, meloxicam 15 mg daily, and flexiril 15 mg BID. She has been having left medial sided knee swelling and pain for the past 3 months with recent giving out of her left knee with no falls. She describes morning stiffness that lasts for 1 hour that somewhat improves throughout the day but with pain that worsens as the day progresses. She reports swelling and pain in her DIP joints. Her knee, hip and jaw bones click and pop frequently. 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 No history of grinding her teeth at night. She recently states she has had throbbing unilateral and alternating headaches with photophobia but no aura, nausea, or vomiting.   She continues to have significant fatigue but when has energy is able to perform PT recommended exercises (underwent PT therapy about 1 year ago). She has tried acupuncture in the past with no success.   She denies fever, weight loss, night sweating, and tender lymph nodes.   Past Medical History  Diagnosis Date  . Hypertension   . Fibromyalgia   . Depression   . Heavy menstrual bleeding     blood transfusion 1/ 2009  . Arthritis   . Lumbar herniated disc   . GERD (gastroesophageal reflux disease)    Current Outpatient Prescriptions  Medication Sig Dispense Refill  . celecoxib (CELEBREX) 100 MG capsule Take 1 capsule (100 mg total) by mouth 2 (two) times daily.  60 capsule   3  . cyclobenzaprine (FLEXERIL) 10 MG tablet Take 1.5 tablets (15 mg total) by mouth 2 (two) times daily as needed for muscle spasms.  90 tablet  0  . DULoxetine (CYMBALTA) 30 MG capsule Take 2 capsules (60 mg total) by mouth daily.  30 capsule  3  . esomeprazole (NEXIUM) 40 MG capsule Take 40 mg by mouth daily before breakfast.      . hydrochlorothiazide (HYDRODIURIL) 12.5 MG tablet Take 1 tablet (12.5 mg total) by mouth daily.  30 tablet  6   No current facility-administered medications for this visit.   Family History  Problem Relation Age of Onset  . Cancer Maternal Aunt     breast  . Heart attack Maternal Grandmother   . Heart attack Maternal Grandfather    History   Social History  . Marital Status: Divorced    Spouse Name: N/A    Number of Children: N/A  . Years of Education: N/A   Social History Main Topics  . Smoking status: Never Smoker   . Smokeless tobacco: Never Used  . Alcohol Use: No  . Drug Use: No  . Sexual Activity: No   Other Topics Concern  . None   Social History Narrative  . None   Review of Systems: Review of Systems  Constitutional: Positive for chills and malaise/fatigue. Negative for fever, weight loss and diaphoresis.  HENT: Positive for congestion. Negative for sore throat.   Eyes: Positive for blurred  vision.  Respiratory: Negative for cough and shortness of breath.   Cardiovascular: Negative for chest pain, palpitations and leg swelling.  Gastrointestinal: Positive for abdominal pain (chronic) and constipation. Negative for heartburn, nausea, vomiting, diarrhea and blood in stool.  Genitourinary: Negative for dysuria, urgency, frequency and hematuria.  Musculoskeletal: Positive for back pain, joint pain, myalgias and neck pain. Negative for falls.  Skin: Negative for rash.  Neurological: Positive for headaches. Negative for dizziness, sensory change and weakness.    Objective:  Physical Exam: Filed Vitals:   10/20/13 1541  BP: 146/80   Pulse: 62  Temp: 97 F (36.1 C)  TempSrc: Oral  Height: 5' 5.5" (1.664 m)  Weight: 217 lb 12.8 oz (98.793 kg)  SpO2: 100%   Physical Exam  Constitutional: She is oriented to person, place, and time. She appears well-developed and well-nourished. No distress.  HENT:  Head: Normocephalic and atraumatic.  Right Ear: External ear normal.  Left Ear: External ear normal.  Nose: Nose normal.  Mouth/Throat: Oropharynx is clear and moist. No oropharyngeal exudate.  Eyes: Conjunctivae and EOM are normal. Pupils are equal, round, and reactive to light. Right eye exhibits no discharge. Left eye exhibits no discharge. No scleral icterus.  Neck: Normal range of motion. Neck supple.  Cardiovascular: Normal rate, regular rhythm and normal heart sounds.   Pulmonary/Chest: Effort normal and breath sounds normal. No respiratory distress. She has no wheezes. She has no rales.  Abdominal: Soft. Bowel sounds are normal. She exhibits no distension. There is no tenderness. There is no rebound and no guarding.  obese  Musculoskeletal: She exhibits tenderness (multiple tender points, worse  distal to the lateral epicondyle bilaterally and medial fat pad of left knee ). She exhibits no edema.  Bony crepitus of left knee with reduced ROM  Neurological: She is alert and oriented to person, place, and time.  Skin: Skin is warm and dry. No rash noted. She is not diaphoretic. No erythema. No pallor.  Psychiatric: She has a normal mood and affect. Her behavior is normal. Judgment and thought content normal.    Assessment & Plan:   Please see problem list for problem based assessment and plan

## 2013-10-20 NOTE — Patient Instructions (Addendum)
-  Will check your blood work today -Continue taking your medications and once able to obtain Cymbalta, take 30 mg daily for 1 week then 60 mg instead of amitriptyline 25 mg daily  -Take celebrex 100 mg BID instead of mobic 15 mg daily once able to obtain medication -Will refer you to optometry -Will see you back in 2 months

## 2013-10-21 LAB — VITAMIN D 25 HYDROXY (VIT D DEFICIENCY, FRACTURES): Vit D, 25-Hydroxy: 28 ng/mL — ABNORMAL LOW (ref 30–89)

## 2013-10-21 LAB — HIV ANTIBODY (ROUTINE TESTING W REFLEX): HIV: NONREACTIVE

## 2013-10-21 LAB — RHEUMATOID FACTOR: Rhuematoid fact SerPl-aCnc: <10 IU/mL (ref <=14)

## 2013-10-21 LAB — CYCLIC CITRUL PEPTIDE ANTIBODY, IGG: Cyclic Citrullin Peptide Ab: <2 U/mL (ref 0.0–5.0)

## 2013-10-21 NOTE — Assessment & Plan Note (Signed)
Assessment: Pt with well-controlled GERD on PPI therapy currently without acid reflux symptoms   Plan: -Continue esomeprazole 40 mg daily

## 2013-10-21 NOTE — Assessment & Plan Note (Signed)
Assessment: Pt with well-controlled blood pressure on anti-hypertensive therapy.   Plan:  -BP 146/80 near goal of <140/80 -Continue 12.5 mg HCTZ -Obtain BMP at next visit

## 2013-10-21 NOTE — Assessment & Plan Note (Addendum)
-  Referral to optometry for annual eye exam -Mammogram scheduled on 11/11/13 -Pap smear scheduled on 11/24/13

## 2013-10-21 NOTE — Progress Notes (Signed)
I saw and evaluated the patient.  I personally confirmed the key portions of Dr. Rabbani's history and exam and reviewed pertinent patient test results.  The assessment, diagnosis, and plan were formulated together and I agree with the documentation in the resident's note. 

## 2013-10-21 NOTE — Assessment & Plan Note (Addendum)
Assessment: Pt with widespread pain involving all four quadrants of the body for more than 3 months and pain in >11 of 18 tender points on digital palpation with negative past work-up  (TSH, HBA1c, HIV, ESR, CRP, CK, ANA, vitamin D) most likely consistent with fibromyalgia who has not had improvement with TCA, muscle relaxant, and NSAID therapy. Pt also with history of osteoarthritis and evidence of bony crepitus, decreased ROM, and non-inflammatory process in left knee. Previous imaging in 2012 and 2013 showing mild to moderate acromioclavicular osteoarthritis, mild right greater than left hip osteoarthritis, facet degenerative disease in the lower lumbar spine, and reduced anterior translation of the right mandibular condyle compared to the left.  Plan: -Continue amitriptlyine 25 mg daily until able to obtain Cymbalta (MAP), then to take 30 mg x 1 week then 60 mg daily  -Continue meloxicam 15 mg daily until able to obtain celecoxib (MAP), then to take 100 mg BID    -Continue flexiril 15 mg BID as needed for muscle spasm -Continue graded exercise therapy -Obtain RF and anti-CCP --> WNL -Obtain annual HIV Ab -Obtain 25 hydroxy Vitamin D, consider intact PTH, calcium at next visit   -Consider Xray knees & bilateral hands at next visit  -Consider referral to rheumatology at next visit  -To return in 2 months

## 2013-11-11 ENCOUNTER — Ambulatory Visit (HOSPITAL_COMMUNITY)
Admission: RE | Admit: 2013-11-11 | Discharge: 2013-11-11 | Disposition: A | Discharge status: Home / Self Care | Payer: No Typology Code available for payment source | Source: Ambulatory Visit | Attending: Internal Medicine | Admitting: Internal Medicine

## 2013-11-11 DIAGNOSIS — Z1231 Encounter for screening mammogram for malignant neoplasm of breast: Secondary | ICD-10-CM | POA: Insufficient documentation

## 2013-11-21 ENCOUNTER — Ambulatory Visit: Payer: No Typology Code available for payment source | Admitting: Nurse Practitioner

## 2013-11-24 ENCOUNTER — Encounter: Payer: Self-pay | Admitting: Nurse Practitioner

## 2013-11-24 ENCOUNTER — Ambulatory Visit (INDEPENDENT_AMBULATORY_CARE_PROVIDER_SITE_OTHER): Payer: No Typology Code available for payment source | Admitting: Nurse Practitioner

## 2013-11-24 VITALS — BP 142/85 | HR 74 | Temp 98.6°F | Ht 65.0 in | Wt 219.3 lb

## 2013-11-24 DIAGNOSIS — Z01419 Encounter for gynecological examination (general) (routine) without abnormal findings: Secondary | ICD-10-CM

## 2013-11-24 DIAGNOSIS — E669 Obesity, unspecified: Secondary | ICD-10-CM

## 2013-11-24 DIAGNOSIS — N951 Menopausal and female climacteric states: Secondary | ICD-10-CM

## 2013-11-24 DIAGNOSIS — Z780 Asymptomatic menopausal state: Secondary | ICD-10-CM

## 2013-11-24 DIAGNOSIS — Z0000 Encounter for general adult medical examination without abnormal findings: Secondary | ICD-10-CM

## 2013-11-24 DIAGNOSIS — D259 Leiomyoma of uterus, unspecified: Secondary | ICD-10-CM

## 2013-11-24 NOTE — Patient Instructions (Signed)
Fibroids Fibroids are lumps (tumors) that can occur any place in a woman's body. These lumps are not cancerous. Fibroids vary in size, weight, and where they grow. HOME CARE  Do not take aspirin.  Write down the number of pads or tampons you use during your period. Tell your doctor. This can help determine the best treatment for you. GET HELP RIGHT AWAY IF:  You have pain in your lower belly (abdomen) that is not helped with medicine.  You have cramps that are not helped with medicine.  You have more bleeding between or during your period.  You feel lightheaded or pass out (faint).  Your lower belly pain gets worse. MAKE SURE YOU:  Understand these instructions.  Will watch your condition.  Will get help right away if you are not doing well or get worse. Document Released: 12/02/2010 Document Revised: 01/22/2012 Document Reviewed: 12/02/2010 [facility] Patient Information 2014 [place], [place]. Obesity Obesity is defined as having too much total body fat and a body mass index (BMI) of 30 or more. BMI is an estimate of body fat and is calculated from your height and weight. Obesity happens when you consume more calories than you can burn by exercising or performing daily physical tasks. Prolonged obesity can cause major illnesses or emergencies, such as:   A stroke.  Heart disease.  Diabetes.  Cancer.  Arthritis.  High blood pressure (hypertension).  High cholesterol.  Sleep apnea.  Erectile dysfunction.  Infertility problems. CAUSES   Regularly eating unhealthy foods.  Physical inactivity.  Certain disorders, such as an underactive thyroid (hypothyroidism), Cushing's syndrome, and polycystic ovarian syndrome.  Certain medicines, such as steroids, some depression medicines, and antipsychotics.  Genetics.  Lack of sleep. DIAGNOSIS  A caregiver can diagnose obesity after calculating your BMI. Obesity will be diagnosed if your BMI is 30 or higher.  There are  other methods of measuring obesity levels. Some other methods include measuring your skin fold thickness, your waist circumference, and comparing your hip circumference to your waist circumference. TREATMENT  A healthy treatment program includes some or all of the following:  Long-term dietary changes.  Exercise and physical activity.  Behavioral and lifestyle changes.  Medicine only under the supervision of your caregiver. Medicines may help, but only if they are used with diet and exercise programs. An unhealthy treatment program includes:  Fasting.  Fad diets.  Supplements and drugs. These choices do not succeed in long-term weight control.  HOME CARE INSTRUCTIONS   Exercise and perform physical activity as directed by your caregiver. To increase physical activity, try the following:  Use stairs instead of elevators.  Park farther away from store entrances.  Garden, bike, or walk instead of watching television or using the computer.  Eat healthy, low-calorie foods and drinks on a regular basis. Eat more fruits and vegetables. Use low-calorie cookbooks or take healthy cooking classes.  Limit fast food, sweets, and processed snack foods.  Eat smaller portions.  Keep a daily journal of everything you eat. There are many free websites to help you with this. It may be helpful to measure your foods so you can determine if you are eating the correct portion sizes.  Avoid drinking alcohol. Drink more water and drinks without calories.  Take vitamins and supplements only as recommended by your caregiver.  Weight-loss support groups, Nurse, mental health, counselors, and stress reduction education can also be very helpful. SEEK IMMEDIATE MEDICAL CARE IF:  You have chest pain or tightness.  You have trouble breathing  or feel short of breath.  You have weakness or leg numbness.  You feel confused or have trouble talking.  You have sudden changes in your vision. MAKE SURE  YOU:  Understand these instructions.  Will watch your condition.  Will get help right away if you are not doing well or get worse. Document Released: 12/07/2004 Document Revised: 04/30/2012 Document Reviewed: 12/06/2011 [facility] Patient Information 2014 [place].

## 2013-11-24 NOTE — Progress Notes (Addendum)
History:  Carolyn Wall is a 58 y.o. G1P1001 who presents to [facility] clinic today for annual exam. Last pap was one year ago. No abnormals. She does have hx of fibroids, had ablation and now no bleeding. Last mammogram 11/11/13 was WNL. She is not sexually active. She is concerned about menopause and hot flashes.  The following portions of the patient's history were reviewed and updated as appropriate: allergies, current medications, past family history, past medical history, past social history, past surgical history and problem list.  Review of Systems:  Pertinent items are noted in HPI.  Objective:  Physical Exam BP 142/85  Pulse 74  Temp(Src) 98.6 F (37 C) (Oral)  Ht 5\' 5"  (1.651 m)  Wt 219 lb 4.8 oz (99.474 kg)  BMI 36.49 kg/m2 GENERAL: Well-developed, well-nourished female in no acute distress.  Obese HEENT: Normocephalic, atraumatic.  NECK: Supple. Normal thyroid.  LUNGS: Normal rate. Clear to auscultation bilaterally.  HEART: Regular rate and rhythm with no adventitious sounds.  BREASTS: Symmetric in size. No masses, skin changes, nipple drainage, or lymphadenopathy. ABDOMEN: Soft, nontender, nondistended. No organomegaly. Normal bowel sounds appreciated in all quadrants.  PELVIC: Normal external female genitalia. Vagina is pink and rugated.  Normal discharge. Normal cervix contour. Pap smear obtained. Uterus is normal in size. No adnexal mass or tenderness. Difficult due to size EXTREMITIES: No cyanosis, clubbing, or edema, 2+ distal pulses.    Labs and Imaging Mm Digital Screening  11/11/2013   CLINICAL DATA:  Screening.  EXAM: DIGITAL SCREENING BILATERAL MAMMOGRAM WITH CAD  COMPARISON:  Previous exam(s)  ACR Breast Density Category a: The breast tissue is almost entirely fatty.  FINDINGS: There are no findings suspicious for malignancy. Images were processed with CAD.  IMPRESSION: No mammographic evidence of malignancy. A result letter of this screening mammogram will be  mailed directly to the patient.  RECOMMENDATION: Screening mammogram in one year. (Code:SM-B-01Y)  BI-RADS CATEGORY  1: Negative   Electronically Signed   By: Lovey Newcomer M.D.   On: 11/11/2013 16:29    Assessment & Plan:  Assessment:  Annual Exam Obesity Uterine Fibroids Menopause HTN Anxiety/ depression  Plans: Continue to lose weight and exercise/ advise her to go to [facility] Will check [facility] and call with results RTC 1 year  Olegario Messier, NP 11/24/2013 4:30 PM

## 2013-11-25 LAB — FOLLICLE STIMULATING HORMONE: FSH: 48.9 m[IU]/mL

## 2013-11-28 ENCOUNTER — Other Ambulatory Visit: Payer: Self-pay | Admitting: *Deleted

## 2013-11-28 DIAGNOSIS — I10 Essential (primary) hypertension: Secondary | ICD-10-CM

## 2013-11-28 MED ORDER — HYDROCHLOROTHIAZIDE 12.5 MG PO TABS: 12.5000 mg | ORAL_TABLET | Freq: Every day | ORAL | Status: DC

## 2013-12-02 NOTE — Telephone Encounter (Signed)
Rx faxed in.

## 2013-12-19 ENCOUNTER — Encounter: Payer: Self-pay | Admitting: Internal Medicine

## 2013-12-19 ENCOUNTER — Encounter: Payer: No Typology Code available for payment source | Admitting: Internal Medicine

## 2013-12-19 ENCOUNTER — Ambulatory Visit: Payer: No Typology Code available for payment source | Admitting: Internal Medicine

## 2013-12-19 ENCOUNTER — Ambulatory Visit (INDEPENDENT_AMBULATORY_CARE_PROVIDER_SITE_OTHER): Payer: No Typology Code available for payment source | Admitting: Internal Medicine

## 2013-12-19 VITALS — BP 141/79 | HR 64 | Temp 97.1°F | Wt 216.4 lb

## 2013-12-19 DIAGNOSIS — F329 Major depressive disorder, single episode, unspecified: Secondary | ICD-10-CM

## 2013-12-19 DIAGNOSIS — K219 Gastro-esophageal reflux disease without esophagitis: Secondary | ICD-10-CM

## 2013-12-19 DIAGNOSIS — F419 Anxiety disorder, unspecified: Secondary | ICD-10-CM

## 2013-12-19 DIAGNOSIS — M797 Fibromyalgia: Secondary | ICD-10-CM

## 2013-12-19 DIAGNOSIS — IMO0001 Reserved for inherently not codable concepts without codable children: Secondary | ICD-10-CM

## 2013-12-19 DIAGNOSIS — K5909 Other constipation: Secondary | ICD-10-CM | POA: Insufficient documentation

## 2013-12-19 DIAGNOSIS — Z0000 Encounter for general adult medical examination without abnormal findings: Secondary | ICD-10-CM

## 2013-12-19 DIAGNOSIS — M1990 Unspecified osteoarthritis, unspecified site: Secondary | ICD-10-CM

## 2013-12-19 DIAGNOSIS — K5900 Constipation, unspecified: Secondary | ICD-10-CM

## 2013-12-19 DIAGNOSIS — I10 Essential (primary) hypertension: Secondary | ICD-10-CM

## 2013-12-19 DIAGNOSIS — E559 Vitamin D deficiency, unspecified: Secondary | ICD-10-CM | POA: Insufficient documentation

## 2013-12-19 MED ORDER — HYDROCHLOROTHIAZIDE 25 MG PO TABS: 12.5000 mg | ORAL_TABLET | Freq: Every day | ORAL | Status: DC

## 2013-12-19 MED ORDER — SENNOSIDES-DOCUSATE SODIUM 8.6-50 MG PO TABS: 2.0000 | ORAL_TABLET | Freq: Every day | ORAL | Status: AC | PRN

## 2013-12-19 MED ORDER — HYDROCHLOROTHIAZIDE 25 MG PO TABS: 25.0000 mg | ORAL_TABLET | Freq: Every day | ORAL | Status: DC

## 2013-12-19 MED ORDER — VITAMIN D 1000 UNITS PO TABS: 1000.0000 [IU] | ORAL_TABLET | Freq: Every day | ORAL | Status: AC

## 2013-12-19 NOTE — Assessment & Plan Note (Addendum)
Assessment: Pt with history of osteoarthritis of acromioclavicular joint, bilateral hips (R>L), facet degenerative disease in the lower lumbar spine, and reduced anterior translation of the right mandibular condyle compared to the left.   Plan:  -Continue celecoxib 100 mg BID   -Continue flexiril 15 mg BID as needed for muscle spasm  -Continue graded exercise therapy  -Dental referral for right anterior jaw translation

## 2013-12-19 NOTE — Progress Notes (Signed)
Patient ID: Carolyn Wall, female   DOB: 28-Sep-1956, 58 y.o.   MRN: [id number]   Subjective:   Patient ID: Carolyn Wall female   DOB: 08-26-1956 58 y.o.   MRN: [id number]  HPI: Carolyn Wall is a 58 y.o.  pleasant woman with past medical history of hypertension, anxiety, depression, fibromyalgia, osteoarthritis, and GERD who presents for follow-up visit of fibromyalgia.   Patient reports that she is now taking cymbalta 30 mg daily and celecoxib 100 mg BID instead of amitriptyline and meloxicam for the past 1.5 months. She reports that after taking 60 mg of Cymbalta she "felt weird" (possibly with headache) so she reduced her dosage to 30 mg daily. She reports that she continues to have whole body pain (burning and aching) with trigger point sites, fatigue, difficulty with sleep, and mood. She continues to have left medial sided knee pain with no recent locking or giving out of her left knee. She denies recent fall. She tries to exercise at least 3 times a week. She denies fever, weight loss, night sweating, and tender lymph nodes.   She continues to have difficulty opening her mouth with difficulty chewing, yawning, and occasionally with swallowing.    She reports she has been taking vitamin D3 1000 U daily since December due to low vitamin D levels. She denies history of fracture, FH of osteoporosis, thyroid disease, or long-term corticosteroid use.   She reports her GERD is well-controlled with prilosec daily with no reflux symptoms. She denies weight loss, dysphagia, hematemesis, or melena.   She recently received mammography, pap smear testing, and an eye exam.  She was told she has dry eyes and requires reading glasses.     Past Medical History  Diagnosis Date  . Hypertension   . Fibromyalgia   . Depression   . Heavy menstrual bleeding     blood transfusion 1/ 2009  . Arthritis   . Lumbar herniated disc   . GERD (gastroesophageal reflux disease)    Current Outpatient  Prescriptions  Medication Sig Dispense Refill  . acetaminophen (TYLENOL) 500 MG tablet Take 500 mg by mouth every 6 (six) hours as needed.      . celecoxib (CELEBREX) 100 MG capsule Take 1 capsule (100 mg total) by mouth 2 (two) times daily.  60 capsule  3  . cyclobenzaprine (FLEXERIL) 10 MG tablet Take 1.5 tablets (15 mg total) by mouth 2 (two) times daily as needed for muscle spasms.  90 tablet  0  . DULoxetine (CYMBALTA) 30 MG capsule Take 2 capsules (60 mg total) by mouth daily.  30 capsule  3  . esomeprazole (NEXIUM) 40 MG capsule Take 40 mg by mouth daily before breakfast.      . hydrochlorothiazide (HYDRODIURIL) 12.5 MG tablet Take 1 tablet (12.5 mg total) by mouth daily.  30 tablet  6  . meloxicam (MOBIC) 15 MG tablet Take 15 mg by mouth daily.       No current facility-administered medications for this visit.   Family History  Problem Relation Age of Onset  . Cancer Maternal Aunt     breast  . Heart attack Maternal Grandmother   . Heart attack Maternal Grandfather    History   Social History  . Marital Status: Divorced    Spouse Name: N/A    Number of Children: N/A  . Years of Education: N/A   Social History Main Topics  . Smoking status: Never Smoker   . Smokeless tobacco: Never Used  . Alcohol Use:  No  . Drug Use: No  . Sexual Activity: No   Other Topics Concern  . Not on file   Social History Narrative  . No narrative on file   Review of Systems: Review of Systems  Constitutional: Positive for malaise/fatigue (chronic). Negative for fever, chills, weight loss and diaphoresis.  HENT: Negative for congestion, ear pain and sore throat.   Eyes: Positive for blurred vision (with reading ).  Respiratory: Negative for cough and shortness of breath.   Cardiovascular: Negative for chest pain, palpitations and leg swelling.  Gastrointestinal: Positive for abdominal pain (lower abdominal pain before BM) and constipation. Negative for heartburn, nausea, vomiting,  diarrhea and blood in stool.  Genitourinary: Negative for dysuria, urgency and frequency.  Skin: Positive for itching (on right buttocks region that resolved with corticosteroid cream) and rash (on right buttocks region that resolved with corticosteroid cream).  Neurological: Positive for sensory change (chronic in legs and fingertips) and headaches (with cymbalta 60 mg ). Negative for dizziness and weakness.  Endo/Heme/Allergies: Does not bruise/bleed easily.  Psychiatric/Behavioral: Negative for depression. The patient is nervous/anxious and has insomnia.     Objective:  Physical Exam: Filed Vitals:   12/19/13 1547  BP: 141/79  Pulse: 64  Temp: 97.1 F (36.2 C)  TempSrc: Oral  Weight: 216 lb 6.4 oz (98.158 kg)  SpO2: 98%   Physical Exam  Constitutional: She is oriented to person, place, and time. She appears well-developed and well-nourished. No distress.  HENT:  Head: Normocephalic and atraumatic.  Right Ear: External ear normal.  Left Ear: External ear normal.  Nose: Nose normal.  Mouth/Throat: Oropharynx is clear and moist. No oropharyngeal exudate.  Eyes: Conjunctivae and EOM are normal. Pupils are equal, round, and reactive to light. Left eye exhibits no discharge. No scleral icterus.  Neck: Normal range of motion. Neck supple.  Cardiovascular: Normal rate, regular rhythm and normal heart sounds.   No murmur heard. Pulmonary/Chest: Effort normal and breath sounds normal. No respiratory distress. She has no wheezes. She has no rales. She exhibits no tenderness.  Abdominal: Soft. Bowel sounds are normal. She exhibits no distension. There is no tenderness. There is no rebound and no guarding.  obese  Musculoskeletal: She exhibits edema (mild left knee) and tenderness ( multiple tender points, worse  distal to the lateral epicondyle bilaterally and medial fat pad of left knee  ).  Decreased ROM of bilateral knees  Neurological: She is alert and oriented to person, place, and  time.  Skin: Skin is warm and dry. No rash noted. She is not diaphoretic. No erythema. No pallor.  Psychiatric: She has a normal mood and affect. Her behavior is normal. Judgment and thought content normal.    Assessment & Plan:   Please see problem-list for problem based assessment and plan

## 2013-12-19 NOTE — Assessment & Plan Note (Addendum)
Assessment: Pt with well-controlled GERD who is compliant with PPI therapy who presents with no acid reflux or alarm symptoms.  Plan: -Continue esomeprazole 40 mg daily  -Continue to monitor for alarm symptoms

## 2013-12-19 NOTE — Assessment & Plan Note (Addendum)
Assessment: Patient with moderately well-controlled blood pressure who is compliant with one-class (diuretic) anti-hypertensive therapy who presents with blood pressure of 141/79 and pre-hypertension since May 2014.   Plan:  -BP 141/79 not at goal of <140/90, increase HCTZ 12.5 mg to 25 mg daily  -To return in 1 month for blood pressure check and BMP

## 2013-12-19 NOTE — Patient Instructions (Addendum)
-  Take 25 mg HCTZ daily instead of 12.5 mg daily -Try to take 60 mg Cymbalta daily if you are able to tolerate it -Take vitamin D 1000U daily -Take Senokot-S 2 tabs daily as needed for constipation  -Will see you back in 1 month to recheck your blood pressure and obtain labs

## 2013-12-19 NOTE — Assessment & Plan Note (Addendum)
-  Optometry referral 2/3, per pt normal eye exam (eye drops for dry eyes and reading glasses recommended) -Pap smear on 1/12 negative for intraepithelial lesions, malignancy, or HPV;  repeat testing in 3 years  -Mammography on 11/11/13 normal; repeat testing in 1 year

## 2013-12-19 NOTE — Assessment & Plan Note (Signed)
Assessment: Pt with well-controlled anxiety and depression who is compliant with newly started antidepressant (SNRI) therapy who presents with mild anxiety and no depressive symptoms.   Plan: -Continue Cymbalta 60 mg daily (if able to tolerate) -Closely monitor mood in setting newly started anti-depressive therapy

## 2013-12-19 NOTE — Assessment & Plan Note (Signed)
Assessment: Pt with mild symptoms of constipation with straining, hard stools, and rectal/lower abdominal pain not controlled with laxative therapy with last colonoscopy on 08/29/12 that did not indicate diverticulosis.    Plan:  -Pt instructed to increase fluid and fiber intake  -Senokot-S 8.6-50 mg 2 tabs PRN daily  -Continue to monitor

## 2013-12-19 NOTE — Assessment & Plan Note (Signed)
Assessment: Pt with moderately well-controlled fibromyalgia who is compliant with anti-depressant (SNRI), NSAID, and muscle relaxant therapy who presents with no worsening of symptoms on newly changed medications for past 1.5 months.   Plan:  -Continue Cymbalta 60 mg daily if able to tolerate (currently on 30 mg daily)  -Continue Celecoxib 100 mg BID  -Continue flexiril 15 mg BID as needed for muscle spasm  -Continue graded exercise therapy  -Closely monitor mood in setting of newly started SNRI therapy

## 2013-12-19 NOTE — Assessment & Plan Note (Addendum)
Assessment: Pt with vitamin insufficiency currently on vitamin D replacement therapy with no recent fractures or symptoms of toxicity.    Plan: -Continue vitamin D3 1000 U daily  -Obtain intact PTH at next visit (to r/o coexisting  primary hyperparathyrodism) -Since healthy adult, no follow-up serum 25(OH)D measurement required after starting supplementation (will obtain in 1 year)

## 2013-12-22 ENCOUNTER — Ambulatory Visit: Payer: No Typology Code available for payment source | Admitting: Internal Medicine

## 2013-12-24 ENCOUNTER — Emergency Department (INDEPENDENT_AMBULATORY_CARE_PROVIDER_SITE_OTHER)
Admission: EM | Admit: 2013-12-24 | Discharge: 2013-12-24 | Disposition: A | Discharge status: Home / Self Care | Payer: No Typology Code available for payment source | Source: Home / Self Care

## 2013-12-24 ENCOUNTER — Encounter (HOSPITAL_COMMUNITY): Payer: Self-pay | Admitting: Emergency Medicine

## 2013-12-24 DIAGNOSIS — I10 Essential (primary) hypertension: Secondary | ICD-10-CM

## 2013-12-24 DIAGNOSIS — R519 Headache, unspecified: Secondary | ICD-10-CM

## 2013-12-24 DIAGNOSIS — R51 Headache: Secondary | ICD-10-CM

## 2013-12-24 LAB — POCT I-STAT, CHEM 8
BUN: 14 mg/dL (ref 6–23)
CALCIUM ION: 1.17 mmol/L (ref 1.12–1.23)
CREATININE: 0.9 mg/dL (ref 0.50–1.10)
Chloride: 101 mEq/L (ref 96–112)
Glucose, Bld: 97 mg/dL (ref 70–99)
HCT: 43 % (ref 36.0–46.0)
Hemoglobin: 14.6 g/dL (ref 12.0–15.0)
Potassium: 3.3 mEq/L — ABNORMAL LOW (ref 3.7–5.3)
Sodium: 141 mEq/L (ref 137–147)
TCO2: 29 mmol/L (ref 0–100)

## 2013-12-24 MED ORDER — KETOROLAC TROMETHAMINE 60 MG/2ML IM SOLN
60.0000 mg | Freq: Once | INTRAMUSCULAR | Status: AC
  Administered 2013-12-24: 60 mg via INTRAMUSCULAR

## 2013-12-24 MED ORDER — KETOROLAC TROMETHAMINE 60 MG/2ML IM SOLN
INTRAMUSCULAR | Status: AC
  Filled 2013-12-24: qty 2

## 2013-12-24 MED ORDER — LISINOPRIL 10 MG PO TABS: 10.0000 mg | ORAL_TABLET | Freq: Every day | ORAL | Status: DC

## 2013-12-24 NOTE — ED Provider Notes (Signed)
CSN: [id number]     Arrival date & time 12/24/13  1842 History   None    Chief Complaint  Patient presents with  . Headache  . Hypertension    (Consider location/radiation/quality/duration/timing/severity/associated sxs/prior Treatment)  HPI  The patient is a 58 year old female presenting tonight with a complaint of headache x3 weeks. Patient states she is concerned because she has hypertension and checked her blood pressure at [facility] where it was elevated. The patient states she discontinued taking her lisinopril last year because her blood pressure was "too low".  The patient states she took her 25 mg dose of hydrochlorothiazide today.   Past Medical History  Diagnosis Date  . Hypertension   . Fibromyalgia   . Depression   . Heavy menstrual bleeding     blood transfusion 1/ 2009  . Arthritis   . Lumbar herniated disc   . GERD (gastroesophageal reflux disease)    Past Surgical History  Procedure Laterality Date  . Endometrial ablation    . Cholecystectomy    . Esophageal dilation    . Cesarean section    . Mouth surgery    . Colonoscopy  08/29/2012    Procedure: COLONOSCOPY;  Surgeon: Wonda Horner, MD;  Location: [facility] ENDOSCOPY;  Service: Endoscopy;  Laterality: N/A;  dr. will bring h&p   Family History  Problem Relation Age of Onset  . Cancer Maternal Aunt     breast  . Heart attack Maternal Grandmother   . Heart attack Maternal Grandfather    History  Substance Use Topics  . Smoking status: Never Smoker   . Smokeless tobacco: Never Used  . Alcohol Use: No   OB History   Grav Para Term Preterm Abortions TAB SAB Ect Mult Living   1 1 1  0 0 0 0 0 0 1     Review of Systems  Constitutional: Negative.   Eyes: Positive for photophobia. Negative for pain and visual disturbance.  Cardiovascular: Negative.   Gastrointestinal: Negative.   Endocrine: Negative.   Genitourinary: Negative.   Musculoskeletal: Negative.   Skin: Negative.   Allergic/Immunologic:  Negative.   Neurological: Positive for headaches. Negative for dizziness, tremors, seizures, syncope, facial asymmetry, speech difficulty, weakness, light-headedness and numbness.  Hematological: Negative.   Psychiatric/Behavioral: Negative.     Allergies  Review of patient's allergies indicates no known allergies.  Home Medications   Current Outpatient Rx  Name  Route  Sig  Dispense  Refill  . acetaminophen (TYLENOL) 500 MG tablet   Oral   Take 500 mg by mouth every 6 (six) hours as needed.         . celecoxib (CELEBREX) 100 MG capsule   Oral   Take 1 capsule (100 mg total) by mouth 2 (two) times daily.   60 capsule   3   . cholecalciferol (VITAMIN D) 1000 UNITS tablet   Oral   Take 1 tablet (1,000 Units total) by mouth daily.   30 tablet   3   . cyclobenzaprine (FLEXERIL) 10 MG tablet   Oral   Take 1.5 tablets (15 mg total) by mouth 2 (two) times daily as needed for muscle spasms.   90 tablet   0   . DULoxetine (CYMBALTA) 30 MG capsule   Oral   Take 2 capsules (60 mg total) by mouth daily.   30 capsule   3   . esomeprazole (NEXIUM) 40 MG capsule   Oral   Take 40 mg by mouth daily before breakfast.         .  hydrochlorothiazide (HYDRODIURIL) 25 MG tablet   Oral   Take 1 tablet (25 mg total) by mouth daily.   30 tablet   6   . lisinopril (PRINIVIL,ZESTRIL) 10 MG tablet   Oral   Take 1 tablet (10 mg total) by mouth daily.   7 tablet   0   . meloxicam (MOBIC) 15 MG tablet   Oral   Take 15 mg by mouth daily.         Marland Kitchen senna-docusate (SENOKOT-S) 8.6-50 MG per tablet   Oral   Take 2 tablets by mouth daily as needed for mild constipation.   30 tablet   1    BP 160/128  Pulse 70  Temp(Src) 98.3 F (36.8 C) (Oral)  Resp 18  SpO2 97%  Physical Exam  Nursing note and vitals reviewed. Constitutional: She is oriented to person, place, and time. She appears well-developed and well-nourished. No distress.  HENT:  Head: Normocephalic and  atraumatic.  Right Ear: External ear normal.  Left Ear: External ear normal.  Nose: Nose normal.  Mouth/Throat: Oropharynx is clear and moist. No oropharyngeal exudate.  Eyes: Conjunctivae and EOM are normal. Pupils are equal, round, and reactive to light. Right eye exhibits no discharge. Left eye exhibits no discharge. No scleral icterus.  Neck: Normal range of motion. Neck supple. No tracheal deviation present.  Cardiovascular: Normal rate, regular rhythm, normal heart sounds and intact distal pulses.  Exam reveals no gallop and no friction rub.   No murmur heard. Pulmonary/Chest: Effort normal and breath sounds normal. No respiratory distress. She has no wheezes. She has no rales. She exhibits no tenderness.  Musculoskeletal: Normal range of motion. She exhibits no edema and no tenderness.  Lymphadenopathy:    She has no cervical adenopathy.  Neurological: She is alert and oriented to person, place, and time. She displays no atrophy, no tremor and normal reflexes. No cranial nerve deficit or sensory deficit. She exhibits normal muscle tone. She displays a negative Romberg sign. Coordination and gait normal. She displays Babinski's sign on the right side. She displays no Babinski's sign on the left side.  Reflex Scores:      Tricep reflexes are 2+ on the right side and 2+ on the left side.      Brachioradialis reflexes are 2+ on the right side and 2+ on the left side.      Patellar reflexes are 2+ on the right side and 2+ on the left side.      Achilles reflexes are 2+ on the right side and 2+ on the left side. No pronator drift noted. Negative Romberg. Heel to toe gait intact.  Skin: Skin is warm and dry. She is not diaphoretic.    ED Course  Procedures (including critical care time) Labs Review Labs Reviewed  POCT I-STAT, CHEM 8 - Abnormal; Notable for the following:    Potassium 3.3 (*)    All other components within normal limits   Imaging Review No results found.    MDM    Final diagnoses:  Hypertension  Headache   Meds ordered this encounter  Medications  . ketorolac (TORADOL) injection 60 mg    Sig:   . lisinopril (PRINIVIL,ZESTRIL) 10 MG tablet    Sig: Take 1 tablet (10 mg total) by mouth daily.    Dispense:  7 tablet    Refill:  0   The patient is to followup with family medicine clinic this week regarding altering her blood pressure medications. The patient  verbalizes understanding and agrees to plan of care.       Jacqualyn Posey, NP 12/24/13 2140

## 2013-12-24 NOTE — ED Notes (Signed)
Headache and high blood pressure is patient's concern.  Patient checked blood pressure earlier today and reports this as 175/99.  Patient was once on lisinopril and pcp stopped this med because bp dropped too low. Patient requesting to be back on this medicine.  Patient reports headache, dizziness, nausea

## 2013-12-24 NOTE — Discharge Instructions (Signed)

## 2013-12-24 NOTE — ED Provider Notes (Signed)
Medical screening examination/treatment/procedure(s) were performed by resident physician or non-physician practitioner and as supervising physician I was immediately available for consultation/collaboration.   Pauline Good MD.   Billy Fischer, MD 12/24/13 2226

## 2013-12-25 ENCOUNTER — Other Ambulatory Visit: Payer: Self-pay | Admitting: *Deleted

## 2013-12-25 DIAGNOSIS — I10 Essential (primary) hypertension: Secondary | ICD-10-CM

## 2013-12-25 MED ORDER — LISINOPRIL 10 MG PO TABS: 10.0000 mg | ORAL_TABLET | Freq: Every day | ORAL | Status: DC

## 2013-12-25 NOTE — Telephone Encounter (Signed)
Pt stated she had call before for a refill on Lisinopril; yesterday BP was 175/99 and she went to UC. Last seen here at [facility] 2/6.

## 2013-12-25 NOTE — Progress Notes (Signed)
Case discussed with Dr. Rabbani at the time of the visit.  We reviewed the resident's history and exam and pertinent patient test results.  I agree with the assessment, diagnosis, and plan of care documented in the resident's note. 

## 2013-12-25 NOTE — Telephone Encounter (Signed)
Last refill Lisinopril 10 May 17th 2013  On combo Lisinopril / HCTZ 10/12.5 Dec 18, 2012  HCTZ just increased from 12.5 to 25 on 12/19/13 and she picked it up. BP high at home and in UC. Explained that may take one - 2 weeks to see full effect of increased HCTZ. Due to severity of increased BP, wil ladd lisinopril 10. Explained risk of low BP. BMP and BP check one - 2 weeks.

## 2013-12-25 NOTE — Telephone Encounter (Signed)
Pt called about refill and appt was scheduled by front office for 01/06/14.

## 2014-01-06 ENCOUNTER — Ambulatory Visit: Payer: No Typology Code available for payment source | Admitting: Internal Medicine

## 2014-01-21 ENCOUNTER — Encounter: Payer: Self-pay | Admitting: Internal Medicine

## 2014-01-21 ENCOUNTER — Ambulatory Visit (INDEPENDENT_AMBULATORY_CARE_PROVIDER_SITE_OTHER): Payer: No Typology Code available for payment source | Admitting: Internal Medicine

## 2014-01-21 VITALS — BP 100/63 | HR 62 | Temp 97.7°F | Ht 65.0 in | Wt 218.4 lb

## 2014-01-21 DIAGNOSIS — I10 Essential (primary) hypertension: Secondary | ICD-10-CM

## 2014-01-21 DIAGNOSIS — F329 Major depressive disorder, single episode, unspecified: Secondary | ICD-10-CM

## 2014-01-21 DIAGNOSIS — IMO0001 Reserved for inherently not codable concepts without codable children: Secondary | ICD-10-CM

## 2014-01-21 DIAGNOSIS — M797 Fibromyalgia: Secondary | ICD-10-CM

## 2014-01-21 DIAGNOSIS — F32A Depression, unspecified: Secondary | ICD-10-CM

## 2014-01-21 DIAGNOSIS — F341 Dysthymic disorder: Secondary | ICD-10-CM

## 2014-01-21 DIAGNOSIS — E876 Hypokalemia: Secondary | ICD-10-CM

## 2014-01-21 DIAGNOSIS — F419 Anxiety disorder, unspecified: Secondary | ICD-10-CM

## 2014-01-21 MED ORDER — HYDROCHLOROTHIAZIDE 25 MG PO TABS: 25.0000 mg | ORAL_TABLET | Freq: Every day | ORAL | Status: DC

## 2014-01-21 MED ORDER — LISINOPRIL 5 MG PO TABS: 5.0000 mg | ORAL_TABLET | Freq: Every day | ORAL | Status: DC

## 2014-01-21 MED ORDER — POTASSIUM CHLORIDE CRYS ER 20 MEQ PO TBCR: 20.0000 meq | EXTENDED_RELEASE_TABLET | Freq: Every day | ORAL | Status: DC

## 2014-01-21 MED ORDER — DULOXETINE HCL 30 MG PO CPEP: 30.0000 mg | ORAL_CAPSULE | Freq: Two times a day (BID) | ORAL | Status: DC

## 2014-01-21 NOTE — Assessment & Plan Note (Signed)
K 3.3 on recheck will supplement with KDur 20 mEq qd while on HCTZ

## 2014-01-21 NOTE — Patient Instructions (Addendum)
General Instructions:  Start taking 5mg  lisinopril instead of 10 mg.  Continue 25 mg HCTZ daily. Take Cymbalta 30 mg twice a day for the next week.  If you continue to have side effects take 30 mg once a day. Start taking the potassium pill. Follow-up  With Dr. Naaman Plummer in 3 months or sooner if needed.  Please bring your medicines with you each time you come.   Medicines may be:  Eye drops  Herbal   Vitamins  Pills  Seeing these help [place] take care of you.  Treatment Goals:  Goals (1 Years of Data) as of 01/21/14   None      Progress Toward Treatment Goals:  Treatment Goal 01/21/2014  Blood pressure at goal    Self Care Goals & Plans:  Self Care Goal 01/21/2014  Manage my medications take my medicines as prescribed; bring my medications to every visit; refill my medications on time  Monitor my health -  Eat healthy foods drink diet soda or water instead of juice or soda; eat more vegetables; eat foods that are low in salt; eat baked foods instead of fried foods; eat fruit for snacks and desserts  Be physically active -  Other -    No flowsheet data found.   Care Management & Community Referrals:  Referral 04/15/2013  Referrals made for care management support none needed

## 2014-01-21 NOTE — Progress Notes (Signed)
   Subjective:    Patient ID: Carolyn Wall, female    DOB: November 16, 1955, 58 y.o.   MRN: [id number]  HPI  Presents for f/u of hypertension, fibromyalgia and depression.  States that 60 mg of Cymbalta still makes her feel "foggy headed".  Bp today 100/63 pulse 63 bpm on lisinopril 10 mg and HCTZ 25 mg qd.  Prior bp 160/128 pulse 70 bpm in Feb 2015.  No shortness of breath, chest pain, or headaches.      Review of Systems  Constitutional: Negative.   HENT: Negative.   Eyes: Negative.   Respiratory: Negative.   Cardiovascular: Negative.   Gastrointestinal: Negative.   Endocrine: Negative.   Genitourinary: Negative.   Neurological:       Foggy headed  Psychiatric/Behavioral: Negative for decreased concentration.       Recent death of close mother-like figure but no depressive sx       Objective:   Physical Exam  Constitutional: She is oriented to person, place, and time. She appears well-developed and well-nourished.  HENT:  Head: Normocephalic and atraumatic.  Eyes: Conjunctivae and EOM are normal. Pupils are equal, round, and reactive to light.  Neck: Normal range of motion. Neck supple.  Cardiovascular: Normal rate and regular rhythm.   Pulmonary/Chest: Effort normal and breath sounds normal.  Abdominal: Soft. Bowel sounds are normal.  Musculoskeletal: Normal range of motion.  Neurological: She is alert and oriented to person, place, and time.  Skin: Skin is warm and dry.  Psychiatric: She has a normal mood and affect.          Assessment & Plan:  See separate problem list charting:

## 2014-01-21 NOTE — Assessment & Plan Note (Addendum)
Will try Cymbalta 30 mg bid instead of 60 mg qd. If still foggy headed may need to go back to 30 mg qd.

## 2014-01-21 NOTE — Assessment & Plan Note (Signed)
BP Readings from Last 3 Encounters:  01/21/14 100/63  12/24/13 160/128  12/19/13 141/79    Lab Results  Component Value Date   NA 141 12/24/2013   K 3.3* 12/24/2013   CREATININE 0.90 12/24/2013    Assessment: Blood pressure control: controlled Progress toward BP goal:  at goal Comments: BP low today 100/63 pulse  62 bpm on lisinopril 10mg  and HCTZ 25 mg qd  Plan: Medications:  continue current medications Educational resources provided:   Self management tools provided:   Other plans: will change lisinopril to 5 mg qd and cont HCTZ 25 mg qd

## 2014-01-22 NOTE — Progress Notes (Signed)
Case discussed with Dr. Schooler at the time of the visit.  We reviewed the resident's history and exam and pertinent patient test results.  I agree with the assessment, diagnosis, and plan of care documented in the resident's note.     

## 2014-01-22 NOTE — Addendum Note (Signed)
Addended by: Truddie Crumble on: 01/22/2014 01:18 PM   Modules accepted: Orders

## 2014-01-30 ENCOUNTER — Encounter: Payer: Self-pay | Admitting: Internal Medicine

## 2014-01-30 ENCOUNTER — Ambulatory Visit (INDEPENDENT_AMBULATORY_CARE_PROVIDER_SITE_OTHER): Payer: No Typology Code available for payment source | Admitting: Internal Medicine

## 2014-01-30 VITALS — BP 102/63 | HR 55 | Temp 98.2°F | Wt 213.6 lb

## 2014-01-30 DIAGNOSIS — IMO0001 Reserved for inherently not codable concepts without codable children: Secondary | ICD-10-CM

## 2014-01-30 DIAGNOSIS — I10 Essential (primary) hypertension: Secondary | ICD-10-CM

## 2014-01-30 DIAGNOSIS — K5900 Constipation, unspecified: Secondary | ICD-10-CM

## 2014-01-30 DIAGNOSIS — F341 Dysthymic disorder: Secondary | ICD-10-CM

## 2014-01-30 DIAGNOSIS — H119 Unspecified disorder of conjunctiva: Secondary | ICD-10-CM

## 2014-01-30 DIAGNOSIS — M797 Fibromyalgia: Secondary | ICD-10-CM

## 2014-01-30 DIAGNOSIS — H04129 Dry eye syndrome of unspecified lacrimal gland: Secondary | ICD-10-CM

## 2014-01-30 DIAGNOSIS — E559 Vitamin D deficiency, unspecified: Secondary | ICD-10-CM

## 2014-01-30 DIAGNOSIS — K219 Gastro-esophageal reflux disease without esophagitis: Secondary | ICD-10-CM

## 2014-01-30 DIAGNOSIS — M1610 Unilateral primary osteoarthritis, unspecified hip: Secondary | ICD-10-CM

## 2014-01-30 DIAGNOSIS — F329 Major depressive disorder, single episode, unspecified: Secondary | ICD-10-CM

## 2014-01-30 DIAGNOSIS — M169 Osteoarthritis of hip, unspecified: Secondary | ICD-10-CM

## 2014-01-30 DIAGNOSIS — F419 Anxiety disorder, unspecified: Secondary | ICD-10-CM

## 2014-01-30 DIAGNOSIS — M1990 Unspecified osteoarthritis, unspecified site: Secondary | ICD-10-CM

## 2014-01-30 LAB — BASIC METABOLIC PANEL WITH GFR
BUN: 15 mg/dL (ref 6–23)
CO2: 30 mEq/L (ref 19–32)
Calcium: 9.6 mg/dL (ref 8.4–10.5)
Chloride: 102 mEq/L (ref 96–112)
Creat: 0.69 mg/dL (ref 0.50–1.10)
GFR, Est African American: >89 mL/min
Glucose, Bld: 88 mg/dL (ref 70–99)
Potassium: 3.6 mEq/L (ref 3.5–5.3)
Sodium: 143 mEq/L (ref 135–145)

## 2014-01-30 NOTE — Progress Notes (Signed)
Patient ID: Carolyn Wall, female   DOB: 02/11/1956, 58 y.o.   MRN: [id number]   Subjective:   Patient ID: Carolyn Wall female   DOB: October 31, 1956 58 y.o.   MRN: [id number]  HPI: [name] is a 58 y.o.  pleasant woman with past medical history of hypertension, anxiety, depression, fibromyalgia, osteoarthritis, and GERD who presents for follow-up visit of hypertension  Since last visit on 2/6 when her HCTZ was increased from 12.5 to 25 mg daily, she was seen in the ED on 2/11 for headache and was found to have blood pressure of 160/128. She was then started on lisinopril 10 mg daily but at that time had only taken newly increased dosage of 25 mg of HCTZ for a few days. She was then seen on 3/11 in clinic and due to low blood pressure of 100/63 her lisinopril was decreased from 10 mg to 5 mg daily.  She has continued taking HCTZ 25 mg daily but has not taken potassium supplements (she just filled the prescription earlier today due to difficulty affording it). Her potassium on 2/11 was 3.3 (istat).  She reports having lightheadedness with standing but denies syncope.    She reports saddened mood due to a recent close death which she reports she is dealing with as best as she can. She is taking cymbalta 30 mg daily and celecoxib 100 mg BID. She reports that when she takes 30 mg BID she "feels weird" (possibly with headache).  She  continues to have whole body pain (burning and aching) with trigger point sites, fatigue, decreased appetite, and insomnia. She tries to exercise as much as she can.  She denies fever, weight loss, night sweating, and tender lymph nodes.   She continues to have limited jaw mobility with difficulty chewing, yawning, and occasionally with swallowing. She is still waiting to hear from the dental referral at last visit to discuss treatment options for TMJ syndrome.  She does daily jaw strengthening exercises.   She reports she has Wall taking vitamin D3 1000 U daily with no  recent fractures or bone pain.     She reports her GERD is well-controlled with prilosec daily with no reflux symptoms. She denies weight loss, dysphagia, hematemesis, or melena.   Her constipation has somewhat improved since taking laxative and stool softner. She has Wall trying to increase her fluid and fiber intake as well.     Past Medical History  Diagnosis Date  . Hypertension   . Fibromyalgia   . Depression   . Heavy menstrual bleeding     blood transfusion 1/ 2009  . Arthritis   . Lumbar herniated disc   . GERD (gastroesophageal reflux disease)    Current Outpatient Prescriptions  Medication Sig Dispense Refill  . acetaminophen (TYLENOL) 500 MG tablet Take 500 mg by mouth every 6 (six) hours as needed.      . celecoxib (CELEBREX) 100 MG capsule Take 1 capsule (100 mg total) by mouth 2 (two) times daily.  60 capsule  3  . cholecalciferol (VITAMIN D) 1000 UNITS tablet Take 1 tablet (1,000 Units total) by mouth daily.  30 tablet  3  . cyclobenzaprine (FLEXERIL) 10 MG tablet Take 1.5 tablets (15 mg total) by mouth 2 (two) times daily as needed for muscle spasms.  90 tablet  0  . DULoxetine (CYMBALTA) 30 MG capsule Take 1 capsule (30 mg total) by mouth 2 (two) times daily.  30 capsule  3  . esomeprazole (NEXIUM) 40 MG capsule  Take 40 mg by mouth daily before breakfast.      . hydrochlorothiazide (HYDRODIURIL) 25 MG tablet Take 1 tablet (25 mg total) by mouth daily.  30 tablet  6  . lisinopril (PRINIVIL,ZESTRIL) 5 MG tablet Take 1 tablet (5 mg total) by mouth daily.  30 tablet  3  . meloxicam (MOBIC) 15 MG tablet Take 15 mg by mouth daily.      . potassium chloride SA (K-DUR,KLOR-CON) 20 MEQ tablet Take 1 tablet (20 mEq total) by mouth daily.  30 tablet  3  . senna-docusate (SENOKOT-S) 8.6-50 MG per tablet Take 2 tablets by mouth daily as needed for mild constipation.  30 tablet  1   No current facility-administered medications for this visit.   Family History  Problem Relation  Age of Onset  . Cancer Maternal Aunt     breast  . Heart attack Maternal Grandmother   . Heart attack Maternal Grandfather    History   Social History  . Marital Status: Divorced    Spouse Name: N/A    Number of Children: N/A  . Years of Education: N/A   Social History Main Topics  . Smoking status: Never Smoker   . Smokeless tobacco: Never Used  . Alcohol Use: No  . Drug Use: No  . Sexual Activity: No   Other Topics Concern  . Not on file   Social History Narrative  . No narrative on file   Review of Systems: Review of Systems  Constitutional: Positive for malaise/fatigue (chronic). Negative for fever, chills, weight loss and diaphoresis.  HENT: Negative for congestion and sore throat.   Eyes: Positive for blurred vision (with reading).  Respiratory: Negative for cough and shortness of breath.   Cardiovascular: Negative for chest pain, palpitations and leg swelling.  Gastrointestinal: Positive for heartburn (controlled with medication). Negative for nausea, vomiting, abdominal pain, diarrhea and constipation.  Genitourinary: Negative for dysuria, urgency, frequency and hematuria.  Musculoskeletal: Positive for joint pain and myalgias. Negative for falls.  Skin: Negative for itching and rash.  Neurological: Positive for dizziness (lightheadedness with standing), tingling (chronic in legs and fingertips) and headaches (mild ). Negative for sensory change and weakness.  Endo/Heme/Allergies: Does not bruise/bleed easily.  Psychiatric/Behavioral: Positive for depression. Negative for suicidal ideas. The patient is nervous/anxious and has insomnia.     Objective:  Physical Exam: Filed Vitals:   01/30/14 1441  BP: 102/63  Pulse: 55  Temp: 98.2 F (36.8 C)  TempSrc: Oral  Weight: 213 lb 9.6 oz (96.888 kg)  SpO2: 99%   .Physical Exam  Constitutional: She is oriented to person, place, and time. She appears well-developed and well-nourished. No distress.  HENT:  Head:  Normocephalic and atraumatic.  Right Ear: External ear normal.  Left Ear: External ear normal.  Nose: Nose normal.  Mouth/Throat: Oropharynx is clear and moist. No oropharyngeal exudate.  Eyes: Conjunctivae and EOM are normal. Right eye exhibits no discharge. Left eye exhibits no discharge. No scleral icterus.  Neck: Normal range of motion. Neck supple.  Cardiovascular: Regular rhythm.   Bradycardic, distant heart sounds  Pulmonary/Chest: Effort normal and breath sounds normal. No respiratory distress. She has no wheezes. She has no rales. She exhibits no tenderness.  Abdominal: Soft. Bowel sounds are normal. She exhibits no distension. There is no tenderness. There is no rebound and no guarding.  obese  Musculoskeletal: She exhibits edema (mild swelling of left knee, below patella ) and tenderness (multiple tender points, worse distal to the lateral  epicondyle b/l and medial fat pad of left knee).  Decreased ROM of both knees L>R  Neurological: She is alert and oriented to person, place, and time.  Skin: Skin is warm and dry. No rash noted. She is not diaphoretic. No erythema. No pallor.  Psychiatric: Her behavior is normal. Judgment and thought content normal.  Anxious mood    Assessment & Plan:  Please see problem list for problem-based assessment and plan

## 2014-01-30 NOTE — Patient Instructions (Addendum)
-  Stop taking lisinopril, continue taking 25 mg daily of HCTZ -Your potassium levels are normal, hold off taking kdur for now  -Continue taking 30 mg daily of Cymbalta  -Will see you back in 1 month to recheck your blood pressure and labs -Very nice seeing you again!!

## 2014-02-01 DIAGNOSIS — H04129 Dry eye syndrome of unspecified lacrimal gland: Secondary | ICD-10-CM | POA: Insufficient documentation

## 2014-02-01 DIAGNOSIS — H119 Unspecified disorder of conjunctiva: Secondary | ICD-10-CM | POA: Insufficient documentation

## 2014-02-01 NOTE — Assessment & Plan Note (Addendum)
Assessment: Patient with history of moderately well-controlled blood pressure with recent change in anti-hypertensive medication with recent lightheadedness who presents with blood pressure of 102/63.    Plan:  -BP 102/63  at goal of  <140/90 but symptomatic  -Discontinue lisinopril 5 mg daily -Continue HCTZ  25 mg daily -Obtain BMP --> normal K (3.6), Na (143) -Pt instructed to hold potassium supplementation at this time  -To return in 1 month for blood pressure check and BMP -If continues to have symptomatic blood pressure, consider decreasing dosage of HCTZ to 12.5 mg daily

## 2014-02-01 NOTE — Assessment & Plan Note (Addendum)
Assessment: Pt with no history of diverticulosis with is complaint with laxative and stool softener therapy who presents with mildly improved symptoms.  Plan:  -Pt instructed to increase fluid and fiber intake  -Senokot-S 8.6-50 mg 2 tabs PRN daily  -Continue to monitor

## 2014-02-01 NOTE — Assessment & Plan Note (Addendum)
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   Plan:  -Monitor lesion yearly (next due on 12/2014)

## 2014-02-01 NOTE — Assessment & Plan Note (Signed)
Assessment: Pt with vitamin insufficiency currently on vitamin D replacement therapy with no recent fractures, bone pain, or symptoms of toxicity.   Plan:  -Continue vitamin D3 1000 U daily  -Obtain intact PTH at next visit (to r/o coexisting primary hyperparathyrodism)  -Since healthy adult, no follow-up serum 25(OH)D measurement required after starting supplementation (will obtain in 1 year - 10/2014)

## 2014-02-01 NOTE — Assessment & Plan Note (Signed)
Assessment: Pt with history of osteoarthritis of acromioclavicular joint, bilateral hips (R>L), facet degenerative disease in the lower lumbar spine, and reduced anterior translation of the right mandibular condyle compared to the left with no recent changes from baseline.   Plan:  -Continue celecoxib 100 mg BID  -Continue flexiril 15 mg BID as needed for muscle spasm  -Continue graded exercise therapy  -Dental referral for right anterior jaw translation --> still in progress

## 2014-02-01 NOTE — Assessment & Plan Note (Addendum)
Assessment: Pt with moderately well-controlled fibromyalgia who is compliant with anti-depressant (SNRI), NSAID, and muscle relaxant therapy who presents with no worsening of baseline symptoms.  Plan:  -Continue Cymbalta 30 mg daily or 30 mg BID if can tolerate (at least 30 mg dosage for fibromyalgia)   -Continue Celecoxib 100 mg BID  -Continue flexiril 15 mg BID as needed for muscle spasm  -Continue graded exercise therapy  -Closely monitor mood in setting of newly started SNRI therapy

## 2014-02-01 NOTE — Assessment & Plan Note (Signed)
Assessment: Pt with moderately well-controlled anxiety and depression who is compliant with antidepressant (SNRI) therapy who presents with depressed mood in setting of recent loss of loved one with appropriate grief.   Plan: -Pt provided with resources for grief support   -Continue Cymbalta 30 mg daily (30 mg BID if able to tolerate)  -Closely monitor mood in setting newly started anti-depressive therapy

## 2014-02-01 NOTE — Assessment & Plan Note (Addendum)
Assessment: Pt with well-controlled GERD who is compliant with PPI therapy who presents with no acid reflux or alarm symptoms.   Plan:  -Continue esomeprazole 40 mg daily  -Continue to monitor

## 2014-02-01 NOTE — Assessment & Plan Note (Addendum)
Assessment: Pt with bilateral dry eye syndrome on recent eye exam on 12/16/13 who presents with mild symptoms.  Plan: -Systane balance x4 daily for 1 month -Warm compresses at bedtime -To return to optometry in 1 month for dry eye check

## 2014-02-04 NOTE — Progress Notes (Signed)
Case discussed with Dr. Rabbani soon after the resident saw the patient.  We reviewed the resident's history and exam and pertinent patient test results.  I agree with the assessment, diagnosis, and plan of care documented in the resident's note. 

## 2014-03-06 ENCOUNTER — Ambulatory Visit (INDEPENDENT_AMBULATORY_CARE_PROVIDER_SITE_OTHER): Payer: No Typology Code available for payment source | Admitting: Internal Medicine

## 2014-03-06 ENCOUNTER — Encounter: Payer: Self-pay | Admitting: Internal Medicine

## 2014-03-06 VITALS — BP 145/81 | HR 54 | Temp 97.0°F | Ht 65.5 in | Wt 216.0 lb

## 2014-03-06 DIAGNOSIS — S8010XA Contusion of unspecified lower leg, initial encounter: Secondary | ICD-10-CM

## 2014-03-06 DIAGNOSIS — M26609 Unspecified temporomandibular joint disorder, unspecified side: Secondary | ICD-10-CM

## 2014-03-06 DIAGNOSIS — I10 Essential (primary) hypertension: Secondary | ICD-10-CM

## 2014-03-06 DIAGNOSIS — X58XXXA Exposure to other specified factors, initial encounter: Secondary | ICD-10-CM

## 2014-03-06 DIAGNOSIS — R109 Unspecified abdominal pain: Secondary | ICD-10-CM | POA: Insufficient documentation

## 2014-03-06 DIAGNOSIS — T148XXA Other injury of unspecified body region, initial encounter: Secondary | ICD-10-CM

## 2014-03-06 LAB — CBC WITH DIFFERENTIAL/PLATELET
Basophils Absolute: 0 10*3/uL (ref 0.0–0.1)
Basophils Relative: 1 % (ref 0–1)
EOS ABS: 0 10*3/uL (ref 0.0–0.7)
Eosinophils Relative: 1 % (ref 0–5)
HCT: 36.4 % (ref 36.0–46.0)
HEMOGLOBIN: 12.9 g/dL (ref 12.0–15.0)
LYMPHS ABS: 1.7 10*3/uL (ref 0.7–4.0)
Lymphocytes Relative: 54 % — ABNORMAL HIGH (ref 12–46)
MCH: 29.3 pg (ref 26.0–34.0)
MCHC: 35.4 g/dL (ref 30.0–36.0)
MCV: 82.7 fL (ref 78.0–100.0)
MONOS PCT: 5 % (ref 3–12)
Monocytes Absolute: 0.2 10*3/uL (ref 0.1–1.0)
NEUTROS PCT: 39 % — AB (ref 43–77)
Neutro Abs: 1.2 10*3/uL — ABNORMAL LOW (ref 1.7–7.7)
Platelets: 269 10*3/uL (ref 150–400)
RBC: 4.4 MIL/uL (ref 3.87–5.11)
RDW: 14 % (ref 11.5–15.5)
WBC: 3.2 10*3/uL — ABNORMAL LOW (ref 4.0–10.5)

## 2014-03-06 LAB — COMPLETE METABOLIC PANEL WITH GFR
ALT: 14 U/L (ref 0–35)
AST: 15 U/L (ref 0–37)
Albumin: 4.1 g/dL (ref 3.5–5.2)
Alkaline Phosphatase: 124 U/L — ABNORMAL HIGH (ref 39–117)
BILIRUBIN TOTAL: 0.9 mg/dL (ref 0.2–1.2)
BUN: 11 mg/dL (ref 6–23)
CO2: 30 mEq/L (ref 19–32)
Calcium: 9.4 mg/dL (ref 8.4–10.5)
Chloride: 101 mEq/L (ref 96–112)
Creat: 0.63 mg/dL (ref 0.50–1.10)
GFR, Est African American: >89 mL/min
GFR, Est Non African American: >89 mL/min
Glucose, Bld: 92 mg/dL (ref 70–99)
Potassium: 4 mEq/L (ref 3.5–5.3)
Sodium: 138 mEq/L (ref 135–145)
Total Protein: 7.1 g/dL (ref 6.0–8.3)

## 2014-03-06 MED ORDER — CYCLOBENZAPRINE HCL 10 MG PO TABS: 10.0000 mg | ORAL_TABLET | Freq: Every day | ORAL | Status: DC

## 2014-03-06 MED ORDER — NAPROXEN 500 MG PO TABS: 500.0000 mg | ORAL_TABLET | Freq: Two times a day (BID) | ORAL | Status: DC

## 2014-03-06 NOTE — Progress Notes (Addendum)
   Subjective:    Patient ID: Carolyn Wall, female    DOB: 1956/03/21, 58 y.o.   MRN: [id number]  HPI Comments: 58 y.o Past Medical History Hypertension, Fibromyalgia, Depression, Heavy menstrual bleeding, Arthritis, Lumbar herniated disc, GERD (gastroesophageal reflux disease), gallstones s/p GB removal, uterine fibroids s/p ablation 2010.          She presents for 1) Blood pressure f/u. BP is 143/86 today. She is taking 12.5 HCTZ and wants to know if she needs to take 25 mg. Her blood pressure at home runs 120s/50s on 25 mg qd.   2) Disc. TMJ.  Noted on imaging in 2011. Right sided TMJ at times she cant open her mouth wide, yawning hurts, her jaw pops/cracks, she has pain daily it is 6/10 today.  She tries Tylenol for relief but not much.  She wants referral to a specialist.   3) Abdominal pain above belly button and around belly button. Pain is intermittent since last visit and she has told her PCP about pain previously. Pain has been going on x 6 months. Pain is 0/10 today but when occurs is sharp intermittent last one to two seconds. Nothing makes the pain worse or better.  She denies constipation or GU blood. She is not sexually active last pap smear normal.   4)car door hit right lower leg 01/2014 and it was swollen but the swelling has gone down and she still has a knot there.  Initially area was bruised but bruising has resolved..  She tried alcohol rubbing onto her leg which helped decrease the swelling.                       Review of Systems  Constitutional: Negative for fever and chills.  Respiratory: Negative for shortness of breath.   Cardiovascular: Negative for chest pain and leg swelling.  Gastrointestinal: Positive for abdominal pain. Negative for constipation and blood in stool.  Genitourinary: Negative for dysuria and vaginal bleeding.       Objective:   Physical Exam  Nursing note and vitals reviewed. Constitutional: She is oriented to person, place, and time.  Vital signs are normal. She appears well-developed and well-nourished. She is cooperative. No distress.  HENT:  Head: Normocephalic and atraumatic.  Mouth/Throat: Oropharynx is clear and moist and mucous membranes are normal. Abnormal dentition. No oropharyngeal exudate.  Eyes: Conjunctivae are normal. Pupils are equal, round, and reactive to light. Right eye exhibits no discharge. Left eye exhibits no discharge. No scleral icterus.  Cardiovascular: Regular rhythm, S1 normal, S2 normal and normal heart sounds.  Bradycardia present.   No murmur heard. No lower ext edema   Pulmonary/Chest: Effort normal and breath sounds normal. No respiratory distress. She has no wheezes.  Abdominal: Soft. Bowel sounds are normal. There is tenderness.    Neurological: She is alert and oriented to person, place, and time. Gait normal.  Skin: Skin is warm and dry. No rash noted. She is not diaphoretic.     Psychiatric: She has a normal mood and affect. Her speech is normal and behavior is normal. Judgment and thought content normal. Cognition and memory are normal.          Assessment & Plan:  F/u in 1 month, sooner if needed otherwise 3 months with PCP

## 2014-03-06 NOTE — Assessment & Plan Note (Addendum)
Periumbilical ab pain (above and b/l to umbilicus).  Etiology seems nonspecific.  Not gallbladder pt had removed years ago, she has chronic pain syndrome may be related, she is not constipated, she does have a h/o uterine fibroids s/p ablation 2010 Will do Ab [place] today to r/o other etiology  Prn Tylenol for pain  Will check CMET, CBC, UA

## 2014-03-06 NOTE — Assessment & Plan Note (Addendum)
Left leg hematoma post trauma 1 month ago  Given info about hematoma  She can try heat RTC 1 month if not improved

## 2014-03-06 NOTE — Assessment & Plan Note (Addendum)
BP Readings from Last 3 Encounters:  03/06/14 143/86  01/30/14 102/63  01/21/14 100/63    Lab Results  Component Value Date   NA 143 01/30/2014   K 3.6 01/30/2014   CREATININE 0.69 01/30/2014    Assessment: Blood pressure control: mildly elevated Progress toward BP goal:  at goal Comments: will recheck 145/81  Plan: Medications:  continue current medications (pt taking HCTZ 12.5 mg daily)  Educational resources provided: brochure;handout;video;other (see comments) Self management tools provided: other (see comments) Other plans: will keep same dose of HCTZ and have PCP reassess at f/u

## 2014-03-06 NOTE — Progress Notes (Signed)
Pt aware abd [place] [facility] 03/07/14 11:30AM - NPO for 6 hrs. Hilda Blades Levester Waldridge RN 03/06/14 5:20PM

## 2014-03-06 NOTE — Assessment & Plan Note (Signed)
Pain with opening mouth on exam  Will Rx Naproxen 500 mg bid x 10 days and Flexeril 10 mg qhs x 10 days Consider referral to specialist

## 2014-03-06 NOTE — Patient Instructions (Addendum)
General Instructions: Take Flexeril 10 mg at night x 10 days for TMJ Take Naproxen 500 mg bid x 10 days for TMJ We will check an [place] of your abdomen today for abdominal pain  You can also take Tylenol for pain (up to 3000 mg daily)  Follow up 1 month sooner if needed    Treatment Goals:  Goals (1 Years of Data) as of 03/06/14         As of Today As of Today 01/30/14 01/21/14 12/24/13     Blood Pressure    . Blood Pressure < 140/90  145/81 143/86 102/63 100/63 160/128      Progress Toward Treatment Goals:  Treatment Goal 03/06/2014  Blood pressure at goal    Self Care Goals & Plans:  Self Care Goal 03/06/2014  Manage my medications take my medicines as prescribed; bring my medications to every visit; refill my medications on time; follow the sick day instructions if I am sick  Monitor my health keep track of my blood pressure  Eat healthy foods eat more vegetables; eat fruit for snacks and desserts; eat baked foods instead of fried foods; eat foods that are low in salt; eat smaller portions; drink diet soda or water instead of juice or soda  Be physically active find an activity I enjoy  Other -  Meeting treatment goals maintain the current self-care plan    No flowsheet data found.   Care Management & Community Referrals:  Referral 03/06/2014  Referrals made for care management support none needed  Referrals made to community resources none       Abdominal Pain, Adult Many things can cause abdominal pain. Usually, abdominal pain is not caused by a disease and will improve without treatment. It can often be observed and treated at home. Your health care provider will do a physical exam and possibly order blood tests and X-rays to help determine the seriousness of your pain. However, in many cases, more time must pass before a clear cause of the pain can be found. Before that point, your health care provider may not know if you need more testing or further treatment. HOME CARE  INSTRUCTIONS  Monitor your abdominal pain for any changes. The following actions may help to alleviate any discomfort you are experiencing:  Only take over-the-counter or prescription medicines as directed by your health care provider.  Do not take laxatives unless directed to do so by your health care provider.  Try a clear liquid diet (broth, tea, or water) as directed by your health care provider. Slowly move to a bland diet as tolerated. SEEK MEDICAL CARE IF:  You have unexplained abdominal pain.  You have abdominal pain associated with nausea or diarrhea.  You have pain when you urinate or have a bowel movement.  You experience abdominal pain that wakes you in the night.  You have abdominal pain that is worsened or improved by eating food.  You have abdominal pain that is worsened with eating fatty foods. SEEK IMMEDIATE MEDICAL CARE IF:   Your pain does not go away within 2 hours.  You have a fever.  You keep throwing up (vomiting).  Your pain is felt only in portions of the abdomen, such as the right side or the left lower portion of the abdomen.  You pass bloody or black tarry stools. MAKE SURE YOU:  Understand these instructions.   Will watch your condition.   Will get help right away if you are not doing well  or get worse.  Document Released: 08/09/2005 Document Revised: 08/20/2013 Document Reviewed: 07/09/2013 [facility] Patient Information 2014 [place].  Temporomandibular Problems  Temporomandibular joint (TMJ) dysfunction means there are problems with the joint between your jaw and your skull. This is a joint lined by cartilage like other joints in your body but also has a small disc in the joint which keeps the bones from rubbing on each other. These joints are like other joints and can get inflamed (sore) from arthritis and other problems. When this joint gets sore, it can cause headaches and pain in the jaw and the face. CAUSES  Usually the  arthritic types of problems are caused by soreness in the joint. Soreness in the joint can also be caused by overuse. This may come from grinding your teeth. It may also come from mis-alignment in the joint. DIAGNOSIS Diagnosis of this condition can often be made by history and exam. Sometimes your caregiver may need X-rays or an MRI scan to determine the exact cause. It may be necessary to see your dentist to determine if your teeth and jaws are lined up correctly. TREATMENT  Most of the time this problem is not serious; however, sometimes it can persist (become chronic). When this happens medications that will cut down on inflammation (soreness) help. Sometimes a shot of cortisone into the joint will be helpful. If your teeth are not aligned it may help for your dentist to make a splint for your mouth that can help this problem. If no physical problems can be found, the problem may come from tension. If tension is found to be the cause, biofeedback or relaxation techniques may be helpful. HOME CARE INSTRUCTIONS   Later in the day, applications of ice packs may be helpful. Ice can be used in a plastic bag with a towel around it to prevent frostbite to skin. This may be used about every 2 hours for 20 to 30 minutes, as needed while awake, or as directed by your caregiver.  Only take over-the-counter or prescription medicines for pain, discomfort, or fever as directed by your caregiver.  If physical therapy was prescribed, follow your caregiver's directions.  Wear mouth appliances as directed if they were given. Document Released: 07/25/2001 Document Revised: 01/22/2012 Document Reviewed: 11/01/2008 [facility] Patient Information 2014 [place], [place].  DASH Diet The DASH diet stands for "Dietary Approaches to Stop Hypertension." It is a healthy eating plan that has been shown to reduce high blood pressure (hypertension) in as little as 14 days, while also possibly providing other significant health  benefits. These other health benefits include reducing the risk of breast cancer after menopause and reducing the risk of type 2 diabetes, heart disease, colon cancer, and stroke. Health benefits also include weight loss and slowing kidney failure in patients with chronic kidney disease.  DIET GUIDELINES  Limit salt (sodium). Your diet should contain less than 1500 mg of sodium daily.  Limit refined or processed carbohydrates. Your diet should include mostly whole grains. Desserts and added sugars should be used sparingly.  Include small amounts of heart-healthy fats. These types of fats include nuts, oils, and tub margarine. Limit saturated and trans fats. These fats have been shown to be harmful in the body. CHOOSING FOODS  The following food groups are based on a 2000 calorie diet. See your Registered Dietitian for individual calorie needs. Grains and Grain Products (6 to 8 servings daily)  Eat More Often: Whole-wheat bread, brown rice, whole-grain or wheat pasta, quinoa, popcorn without  added fat or salt (air popped).  Eat Less Often: White bread, white pasta, white rice, cornbread. Vegetables (4 to 5 servings daily)  Eat More Often: Fresh, frozen, and canned vegetables. Vegetables may be raw, steamed, roasted, or grilled with a minimal amount of fat.  Eat Less Often/Avoid: Creamed or fried vegetables. Vegetables in a cheese sauce. Fruit (4 to 5 servings daily)  Eat More Often: All fresh, canned (in natural juice), or frozen fruits. Dried fruits without added sugar. One hundred percent fruit juice ( cup [237 mL] daily).  Eat Less Often: Dried fruits with added sugar. Canned fruit in light or heavy syrup. [employer], Fish, and Poultry (2 servings or less daily. One serving is 3 to 4 oz [85-114 g]).  Eat More Often: Ninety percent or leaner ground beef, tenderloin, sirloin. Round cuts of beef, chicken breast, [place] breast. All fish. Grill, bake, or broil your meat. Nothing should be  fried.  Eat Less Often/Avoid: Fatty cuts of meat, [place], or chicken leg, thigh, or wing. Fried cuts of meat or fish. Dairy (2 to 3 servings)  Eat More Often: Low-fat or fat-free milk, low-fat plain or light yogurt, reduced-fat or part-skim cheese.  Eat Less Often/Avoid: Milk (whole, 2%).Whole milk yogurt. Full-fat cheeses. Nuts, Seeds, and Legumes (4 to 5 servings per week)  Eat More Often: All without added salt.  Eat Less Often/Avoid: Salted nuts and seeds, canned beans with added salt. Fats and Sweets (limited)  Eat More Often: Vegetable oils, tub margarines without trans fats, sugar-free gelatin. Mayonnaise and salad dressings.  Eat Less Often/Avoid: Coconut oils, palm oils, butter, stick margarine, cream, half and half, cookies, candy, pie. FOR MORE INFORMATION The Dash Diet Eating Plan: www.dashdiet.org Document Released: 10/19/2011 Document Revised: 01/22/2012 Document Reviewed: 10/19/2011 [facility] Patient Information 2014 [place], [place].  Hypertension As your heart beats, it forces blood through your arteries. This force is your blood pressure. If the pressure is too high, it is called hypertension (HTN) or high blood pressure. HTN is dangerous because you may have it and not know it. High blood pressure may mean that your heart has to work harder to pump blood. Your arteries may be narrow or stiff. The extra work puts you at risk for heart disease, stroke, and other problems.  Blood pressure consists of two numbers, a higher number over a lower, 110/72, for example. It is stated as "110 over 72." The ideal is below 120 for the top number (systolic) and under 80 for the bottom (diastolic). Write down your blood pressure today. You should pay close attention to your blood pressure if you have certain conditions such as:  Heart failure.  Prior heart attack.  Diabetes  Chronic kidney disease.  Prior stroke.  Multiple risk factors for heart disease. To see if you have  HTN, your blood pressure should be measured while you are seated with your arm held at the level of the heart. It should be measured at least twice. A one-time elevated blood pressure reading (especially in the Emergency Department) does not mean that you need treatment. There may be conditions in which the blood pressure is different between your right and left arms. It is important to see your caregiver soon for a recheck. Most people have essential hypertension which means that there is not a specific cause. This type of high blood pressure may be lowered by changing lifestyle factors such as:  Stress.  Smoking.  Lack of exercise.  Excessive weight.  Drug/tobacco/alcohol use.  Eating  less salt. Most people do not have symptoms from high blood pressure until it has caused damage to the body. Effective treatment can often prevent, delay or reduce that damage. TREATMENT  When a cause has been identified, treatment for high blood pressure is directed at the cause. There are a large number of medications to treat HTN. These fall into several categories, and your caregiver will help you select the medicines that are best for you. Medications may have side effects. You should review side effects with your caregiver. If your blood pressure stays high after you have made lifestyle changes or started on medicines,   Your medication(s) may need to be changed.  Other problems may need to be addressed.  Be certain you understand your prescriptions, and know how and when to take your medicine.  Be sure to follow up with your caregiver within the time frame advised (usually within two weeks) to have your blood pressure rechecked and to review your medications.  If you are taking more than one medicine to lower your blood pressure, make sure you know how and at what times they should be taken. Taking two medicines at the same time can result in blood pressure that is too low. SEEK IMMEDIATE MEDICAL  CARE IF:  You develop a severe headache, blurred or changing vision, or confusion.  You have unusual weakness or numbness, or a faint feeling.  You have severe chest or abdominal pain, vomiting, or breathing problems. MAKE SURE YOU:   Understand these instructions.  Will watch your condition.  Will get help right away if you are not doing well or get worse. Document Released: 10/30/2005 Document Revised: 01/22/2012 Document Reviewed: 06/19/2008 [facility] Patient Information 2014 [place]. Exercise to Lose Weight Exercise and a healthy diet may help you lose weight. Your doctor may suggest specific exercises. EXERCISE IDEAS AND TIPS  Choose low-cost things you enjoy doing, such as walking, bicycling, or exercising to workout videos.  Take stairs instead of the elevator.  Walk during your lunch break.  Park your car further away from work or school.  Go to a gym or an exercise class.  Start with 5 to 10 minutes of exercise each day. Build up to 30 minutes of exercise 4 to 6 days a week.  Wear shoes with good support and comfortable clothes.  Stretch before and after working out.  Work out until you breathe harder and your heart beats faster.  Drink extra water when you exercise.  Do not do so much that you hurt yourself, feel dizzy, or get very short of breath. Exercises that burn about 150 calories:  Running 1  miles in 15 minutes.  Playing volleyball for 45 to 60 minutes.  Washing and waxing a car for 45 to 60 minutes.  Playing touch football for 45 minutes.  Walking 1  miles in 35 minutes.  Pushing a stroller 1  miles in 30 minutes.  Playing basketball for 30 minutes.  Raking leaves for 30 minutes.  Bicycling 5 miles in 30 minutes.  Walking 2 miles in 30 minutes.  Dancing for 30 minutes.  Shoveling snow for 15 minutes.  Swimming laps for 20 minutes.  Walking up stairs for 15 minutes.  Bicycling 4 miles in 15 minutes.  Gardening for 30  to 45 minutes.  Jumping rope for 15 minutes.  Washing windows or floors for 45 to 60 minutes. Document Released: 12/02/2010 Document Revised: 01/22/2012 Document Reviewed: 12/02/2010 [facility] Patient Information 2014 [place], [place].  Hematoma A hematoma is a collection of blood under the skin, in an organ, in a body space, in a joint space, or in other tissue. The blood can clot to form a lump that you can see and feel. The lump is often firm and may sometimes become sore and tender. Most hematomas get better in a few days to weeks. However, some hematomas may be serious and require medical care. Hematomas can range in size from very small to very large. CAUSES  A hematoma can be caused by a blunt or penetrating injury. It can also be caused by spontaneous leakage from a blood vessel under the skin. Spontaneous leakage from a blood vessel is more likely to occur in older people, especially those taking blood thinners. Sometimes, a hematoma can develop after certain medical procedures. SIGNS AND SYMPTOMS   A firm lump on the body.  Possible pain and tenderness in the area.  Bruising.Blue, dark blue, purple-red, or yellowish skin may appear at the site of the hematoma if the hematoma is close to the surface of the skin. For hematomas in deeper tissues or body spaces, the signs and symptoms may be subtle. For example, an intra-abdominal hematoma may cause abdominal pain, weakness, fainting, and shortness of breath. An intracranial hematoma may cause a headache or symptoms such as weakness, trouble speaking, or a change in consciousness. DIAGNOSIS  A hematoma can usually be diagnosed based on your medical history and a physical exam. Imaging tests may be needed if your health care provider suspects a hematoma in deeper tissues or body spaces, such as the abdomen, head, or chest. These tests may include ultrasonography or a CT scan.  TREATMENT  Hematomas usually go away on their own over time.  Rarely does the blood need to be drained out of the body. Large hematomas or those that may affect vital organs will sometimes need surgical drainage or monitoring. HOME CARE INSTRUCTIONS   Apply ice to the injured area:   Put ice in a plastic bag.   Place a towel between your skin and the bag.   Leave the ice on for 20 minutes, 2 3 times a day for the first 1 to 2 days.   After the first 2 days, switch to using warm compresses on the hematoma.   Elevate the injured area to help decrease pain and swelling. Wrapping the area with an elastic bandage may also be helpful. Compression helps to reduce swelling and promotes shrinking of the hematoma. Make sure the bandage is not wrapped too tight.   If your hematoma is on a lower extremity and is painful, crutches may be helpful for a couple days.   Only take over-the-counter or prescription medicines as directed by your health care provider. SEEK IMMEDIATE MEDICAL CARE IF:   You have increasing pain, or your pain is not controlled with medicine.   You have a fever.   You have worsening swelling or discoloration.   Your skin over the hematoma breaks or starts bleeding.   Your hematoma is in your chest or abdomen and you have weakness, shortness of breath, or a change in consciousness.  Your hematoma is on your scalp (caused by a fall or injury) and you have a worsening headache or a change in alertness or consciousness. MAKE SURE YOU:   Understand these instructions.  Will watch your condition.  Will get help right away if you are not doing well or get worse. Document Released: 06/13/2004 Document Revised: 07/02/2013 Document Reviewed:  04/09/2013 [employer] Patient Information 2014 [employer].  

## 2014-03-07 ENCOUNTER — Ambulatory Visit (HOSPITAL_BASED_OUTPATIENT_CLINIC_OR_DEPARTMENT_OTHER)
Admission: RE | Admit: 2014-03-07 | Discharge: 2014-03-07 | Disposition: A | Discharge status: Home / Self Care | Payer: No Typology Code available for payment source | Source: Ambulatory Visit | Attending: Internal Medicine | Admitting: Internal Medicine

## 2014-03-07 DIAGNOSIS — R109 Unspecified abdominal pain: Secondary | ICD-10-CM | POA: Insufficient documentation

## 2014-03-07 LAB — URINALYSIS, ROUTINE W REFLEX MICROSCOPIC
Bilirubin Urine: NEGATIVE
GLUCOSE, UA: NEGATIVE mg/dL
Hgb urine dipstick: NEGATIVE
KETONES UR: NEGATIVE mg/dL
LEUKOCYTES UA: NEGATIVE
NITRITE: NEGATIVE
PH: 7 (ref 5.0–8.0)
Protein, ur: NEGATIVE mg/dL
Specific Gravity, Urine: 1.01 (ref 1.005–1.030)
Urobilinogen, UA: 0.2 mg/dL (ref 0.0–1.0)

## 2014-03-09 ENCOUNTER — Encounter: Payer: Self-pay | Admitting: Internal Medicine

## 2014-03-09 NOTE — Progress Notes (Signed)
INTERNAL MEDICINE TEACHING ATTENDING ADDENDUM - Kiyaan Haq, MD: I reviewed and discussed at the time of visit with the resident Dr. McLean, the patient's medical history, physical examination, diagnosis and results of tests and treatment and I agree with the patient's care as documented. 

## 2014-04-01 ENCOUNTER — Ambulatory Visit: Payer: No Typology Code available for payment source

## 2014-04-10 ENCOUNTER — Ambulatory Visit: Payer: Self-pay

## 2014-04-10 ENCOUNTER — Encounter: Payer: Self-pay | Admitting: Internal Medicine

## 2014-04-14 ENCOUNTER — Encounter: Payer: Self-pay | Admitting: Internal Medicine

## 2014-04-14 ENCOUNTER — Ambulatory Visit: Payer: Self-pay

## 2014-04-29 ENCOUNTER — Ambulatory Visit (INDEPENDENT_AMBULATORY_CARE_PROVIDER_SITE_OTHER): Payer: No Typology Code available for payment source | Admitting: Internal Medicine

## 2014-04-29 ENCOUNTER — Encounter: Payer: Self-pay | Admitting: Internal Medicine

## 2014-04-29 ENCOUNTER — Ambulatory Visit: Payer: Self-pay

## 2014-04-29 VITALS — BP 136/84 | HR 66 | Temp 97.2°F | Wt 217.2 lb

## 2014-04-29 DIAGNOSIS — M159 Polyosteoarthritis, unspecified: Secondary | ICD-10-CM

## 2014-04-29 DIAGNOSIS — R29898 Other symptoms and signs involving the musculoskeletal system: Secondary | ICD-10-CM

## 2014-04-29 DIAGNOSIS — H04129 Dry eye syndrome of unspecified lacrimal gland: Secondary | ICD-10-CM

## 2014-04-29 DIAGNOSIS — F419 Anxiety disorder, unspecified: Secondary | ICD-10-CM

## 2014-04-29 DIAGNOSIS — F341 Dysthymic disorder: Secondary | ICD-10-CM

## 2014-04-29 DIAGNOSIS — IMO0001 Reserved for inherently not codable concepts without codable children: Secondary | ICD-10-CM

## 2014-04-29 DIAGNOSIS — R609 Edema, unspecified: Secondary | ICD-10-CM

## 2014-04-29 DIAGNOSIS — M797 Fibromyalgia: Secondary | ICD-10-CM

## 2014-04-29 DIAGNOSIS — I10 Essential (primary) hypertension: Secondary | ICD-10-CM

## 2014-04-29 DIAGNOSIS — M25469 Effusion, unspecified knee: Secondary | ICD-10-CM

## 2014-04-29 DIAGNOSIS — F329 Major depressive disorder, single episode, unspecified: Secondary | ICD-10-CM

## 2014-04-29 MED ORDER — METHOCARBAMOL 500 MG PO TABS
1000.0000 mg | ORAL_TABLET | Freq: Four times a day (QID) | ORAL | Status: DC | PRN
Start: 2014-04-29 — End: 2014-08-21

## 2014-04-29 MED ORDER — HYDROCHLOROTHIAZIDE 25 MG PO TABS: 12.5000 mg | ORAL_TABLET | Freq: Every day | ORAL | Status: DC

## 2014-04-29 NOTE — Progress Notes (Signed)
Patient ID: Carolyn Wall, female   DOB: 09/08/1956, 58 y.o.   MRN: [id number]     Subjective:   Patient ID: Carolyn Wall female   DOB: 09/29/1956 58 y.o.   MRN: [id number]  HPI: Carolyn Wall is a 58 y.o. pleasant woman with past medical history of hypertension, anxiety, depression, fibromyalgia, osteoarthritis, and GERD who presents for routine follow-up visit.   She reports taking HCTZ 12.5 mg daily with no recent headache, chest pain, palpitations, LE swelling, or lightheadedness.   She reports her mood is stable and is compliant with taking cymbalta 30 mg daily. She continues to have whole body pain at trigger point sites and generalized fatigue, but denies poor appetite and insomnia. She tries to exercise as much as she can. She denies fever, weight loss, night sweating, or tender lymph nodes.   She has chronic low back pain that is 5/10 in intensity without weakness, bowel/bladder incontinence, or paraesthesias that is unrelieved with naproxen that was prescribed at last visit.  She also reports mild swelling and a knot in her left popliteal area with no warmth or pain. She denies history of DVT or Baker's cyst.    She continues to have limited jaw mobility with difficulty chewing, yawning, and occasionally with swallowing. 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 She does daily jaw strengthening exercises. Flexiril has not been effective in helping with muscle spasms. She has never tried robaxin in the past.   She reports her GERD is well-controlled with nexium daily with no reflux symptoms. She denies weight loss, dysphagia, hematemesis, or melena.   She reports improvement of her dry eyes and uses eye drops as needed. She is to return to optometry on a as needed basis.     Past Medical History  Diagnosis Date  . Hypertension   . Fibromyalgia   . Depression   . Heavy menstrual bleeding     blood transfusion 1/ 2009  .  Arthritis   . Lumbar herniated disc   . GERD (gastroesophageal reflux disease)    Current Outpatient Prescriptions  Medication Sig Dispense Refill  . acetaminophen (TYLENOL) 500 MG tablet Take 500 mg by mouth every 6 (six) hours as needed.      . celecoxib (CELEBREX) 100 MG capsule Take 1 capsule (100 mg total) by mouth 2 (two) times daily.  60 capsule  3  . cholecalciferol (VITAMIN D) 1000 UNITS tablet Take 1 tablet (1,000 Units total) by mouth daily.  30 tablet  3  . cyclobenzaprine (FLEXERIL) 10 MG tablet Take 1 tablet (10 mg total) by mouth at bedtime.  10 tablet  0  . DULoxetine (CYMBALTA) 30 MG capsule Take 1 capsule (30 mg total) by mouth 2 (two) times daily.  30 capsule  3  . esomeprazole (NEXIUM) 40 MG capsule Take 40 mg by mouth daily before breakfast.      . hydrochlorothiazide (HYDRODIURIL) 25 MG tablet Take 12.5 mg by mouth daily.      . naproxen (NAPROSYN) 500 MG tablet Take 1 tablet (500 mg total) by mouth 2 (two) times daily with a meal.  20 tablet  0  . senna-docusate (SENOKOT-S) 8.6-50 MG per tablet Take 2 tablets by mouth daily as needed for mild constipation.  30 tablet  1   No current facility-administered medications for this visit.   Family History  Problem Relation Age of Onset  . Cancer Maternal Aunt     breast  .  Heart attack Maternal Grandmother   . Heart attack Maternal Grandfather    History   Social History  . Marital Status: Divorced    Spouse Name: N/A    Number of Children: N/A  . Years of Education: N/A   Social History Main Topics  . Smoking status: Never Smoker   . Smokeless tobacco: Never Used  . Alcohol Use: No  . Drug Use: No  . Sexual Activity: No   Other Topics Concern  . Not on file   Social History Narrative  . No narrative on file   Review of Systems: Review of Systems  Constitutional: Positive for malaise/fatigue (chronic at baseline). Negative for fever, chills and weight loss.  HENT: Negative for congestion and sore  throat.   Eyes: Positive for blurred vision (with reading).  Cardiovascular: Negative for chest pain and leg swelling.  Gastrointestinal: Positive for constipation (chronic). Negative for heartburn (controlled with PPI therapy), nausea, vomiting, abdominal pain, diarrhea and blood in stool.  Genitourinary: Negative for dysuria, urgency, frequency and hematuria.  Musculoskeletal: Positive for back pain (chronic at baseline) and myalgias (chronic at trigger point sites).       Left popliteal knot  Skin: Negative for rash.  Neurological: Negative for dizziness, sensory change, focal weakness, weakness and headaches.  Psychiatric/Behavioral: Negative for depression. The patient is not nervous/anxious and does not have insomnia.     Objective:  Physical Exam: Filed Vitals:   04/29/14 1415  BP: 136/84  Pulse: 66  Temp: 97.2 F (36.2 C)  TempSrc: Oral  Weight: 217 lb 3.2 oz (98.521 kg)  SpO2: 98%    Physical Exam  Constitutional: She is oriented to person, place, and time. She appears well-developed and well-nourished. No distress.  HENT:  Head: Normocephalic and atraumatic.  Right Ear: External ear normal.  Left Ear: External ear normal.  Nose: Nose normal.  Mouth/Throat: Oropharynx is clear and moist. No oropharyngeal exudate.  Eyes: Conjunctivae and EOM are normal. Pupils are equal, round, and reactive to light. Right eye exhibits no discharge. Left eye exhibits no discharge. No scleral icterus.  Neck: Normal range of motion. Neck supple.  Cardiovascular: Normal rate, regular rhythm and normal heart sounds.   Pulmonary/Chest: Effort normal and breath sounds normal. No respiratory distress. She has no wheezes. She has no rales.  Abdominal: Soft. Bowel sounds are normal. She exhibits no distension. There is no tenderness. There is no rebound and no guarding.  Musculoskeletal: Normal range of motion. She exhibits no edema and no tenderness.  Palpable area in left popliteal space with  mild swelling Multiple tender points, worse distal toe the lateral epicondyle b/l and medial fat pad of left knee  Lymphadenopathy:    She has no cervical adenopathy.  Neurological: She is alert and oriented to person, place, and time. No cranial nerve deficit.  Normal 5/5 muscle strength Normal sensation to light touch  Skin: Skin is warm and dry. No rash noted. She is not diaphoretic. No erythema. No pallor.  Psychiatric: She has a normal mood and affect. Her behavior is normal. Judgment and thought content normal.    Assessment & Plan:   Please see problem list for problem-based assessment and plan

## 2014-04-29 NOTE — Patient Instructions (Addendum)
-Take robaxin 1000 mg four times a day as needed for muscle spasm -I refilled your HCTZ, continue taking 12.5 mg daily  -Will refer you to dentistry for dentures  -Will get a doppler ultrasound of your legs  -Will see you back in 6 months, nice seeing you!  Temporomandibular Problems  Temporomandibular joint (TMJ) dysfunction means there are problems with the joint between your jaw and your skull. This is a joint lined by cartilage like other joints in your body but also has a small disc in the joint which keeps the bones from rubbing on each other. These joints are like other joints and can get inflamed (sore) from arthritis and other problems. When this joint gets sore, it can cause headaches and pain in the jaw and the face. CAUSES  Usually the arthritic types of problems are caused by soreness in the joint. Soreness in the joint can also be caused by overuse. This may come from grinding your teeth. It may also come from mis-alignment in the joint. DIAGNOSIS Diagnosis of this condition can often be made by history and exam. Sometimes your caregiver may need X-rays or an MRI scan to determine the exact cause. It may be necessary to see your dentist to determine if your teeth and jaws are lined up correctly. TREATMENT  Most of the time this problem is not serious; however, sometimes it can persist (become chronic). When this happens medications that will cut down on inflammation (soreness) help. Sometimes a shot of cortisone into the joint will be helpful. If your teeth are not aligned it may help for your dentist to make a splint for your mouth that can help this problem. If no physical problems can be found, the problem may come from tension. If tension is found to be the cause, biofeedback or relaxation techniques may be helpful. HOME CARE INSTRUCTIONS   Later in the day, applications of ice packs may be helpful. Ice can be used in a plastic bag with a towel around it to prevent frostbite to  skin. This may be used about every 2 hours for 20 to 30 minutes, as needed while awake, or as directed by your caregiver.  Only take over-the-counter or prescription medicines for pain, discomfort, or fever as directed by your caregiver.  If physical therapy was prescribed, follow your caregiver's directions.  Wear mouth appliances as directed if they were given. Document Released: 07/25/2001 Document Revised: 01/22/2012 Document Reviewed: 11/01/2008 [facility] Patient Information 2015 [place], [place]. This information is not intended to replace advice given to you by your health care provider. Make sure you discuss any questions you have with your health care provider.     General Instructions:   Please try to bring all your medicines next time. This will help [place] keep you safe from mistakes.   Progress Toward Treatment Goals:  Treatment Goal 03/06/2014  Blood pressure at goal    Self Care Goals & Plans:  Self Care Goal 03/06/2014  Manage my medications take my medicines as prescribed; bring my medications to every visit; refill my medications on time; follow the sick day instructions if I am sick  Monitor my health keep track of my blood pressure  Eat healthy foods eat more vegetables; eat fruit for snacks and desserts; eat baked foods instead of fried foods; eat foods that are low in salt; eat smaller portions; drink diet soda or water instead of juice or soda  Be physically active find an activity I enjoy  Other -  Meeting treatment goals maintain the current self-care plan    No flowsheet data found.   Care Management & Community Referrals:  Referral 03/06/2014  Referrals made for care management support none needed  Referrals made to community resources none

## 2014-05-01 DIAGNOSIS — R29898 Other symptoms and signs involving the musculoskeletal system: Secondary | ICD-10-CM | POA: Insufficient documentation

## 2014-05-01 NOTE — Assessment & Plan Note (Addendum)
Assessment: Pt with painelss left popliteal fullness and mild swelling with no history of DVT or Baker's cyst.    Plan: -Obtain b/l LE doppler US to r/o Baker's cyst & DVT

## 2014-05-01 NOTE — Assessment & Plan Note (Addendum)
Assessment: Pt with moderately well-controlled fibromyalgia who is compliant with anti-depressant (SNRI), NSAID, and muscle relaxant therapy who presents with no worsening of baseline symptoms.   Plan:  -Continue Cymbalta 30 mg daily or BID if can tolerate (at least 30 mg dosage for fibromyalgia)  -Continue Celecoxib 100 mg BID  -Start methocarbamol (robaxin) 1000 mg TID PRN muscle spasm -Discontinue flexiril 15 mg BID as pt reported drowsiness with no effect -Continue graded exercise therapy

## 2014-05-01 NOTE — Assessment & Plan Note (Signed)
Assessment: Patient with moderately well-controlled hypertension compliant with one-class (diuretic) anti-hypertensive therapy who presents with blood pressure of 136/84.   Plan:  -BP 136/84 at goal of <140/90  -Continue HCTZ 12.5 mg daily -Last BMP 03/06/14 normal, repeat at next visit

## 2014-05-01 NOTE — Assessment & Plan Note (Addendum)
Assessment: Pt with history of osteoarthritis of acromioclavicular joint, bilateral hips (R>L), facet degenerative disease in the lower lumbar spine, and reduced anterior translation of the right mandibular condyle compared to the left who presents with persistent jaw and low back pain with no alarm symptoms.   Plan:  -Continue celecoxib 100 mg BID  -Start methocarbamol (robaxin) 1000 mg TID PRN muscle spasm -Discontinue flexiril 15 mg BID as pt reported drowsiness with no effect -Continue graded exercise therapy  -Dental referral for right anterior jaw translation & dentures

## 2014-05-01 NOTE — Assessment & Plan Note (Addendum)
Assessment: Pt with bilateral dry eye syndrome who presents with improved symptoms.   Plan:  -Continue eye drops PRN  -To return to optometry on as needed basis

## 2014-05-01 NOTE — Assessment & Plan Note (Signed)
Assessment: Pt with moderately well-controlled anxiety and depression who is compliant with antidepressant (SNRI) therapy who presents with stable mood.   Plan:  -Continue Cymbalta 30 mg daily (30 mg BID if able to tolerate)  -Continue to monitor mood

## 2014-05-04 NOTE — Progress Notes (Signed)
Case discussed with Dr. Rabbani at the time of the visit.  We reviewed the resident's history and exam and pertinent patient test results.  I agree with the assessment, diagnosis, and plan of care documented in the resident's note. 

## 2014-05-22 IMAGING — US US TRANSVAGINAL NON-OB
1 series · 13 of 25 positions shown · non-contrast
Comparison: 08/27/2009

CLINICAL DATA: Post menopausal bleeding.  Pelvic pain.  Previous
endometrial ablation in 2383.



[Series 1: us pelvis complete · 13 of 58 slices shown]
[im 1/58]
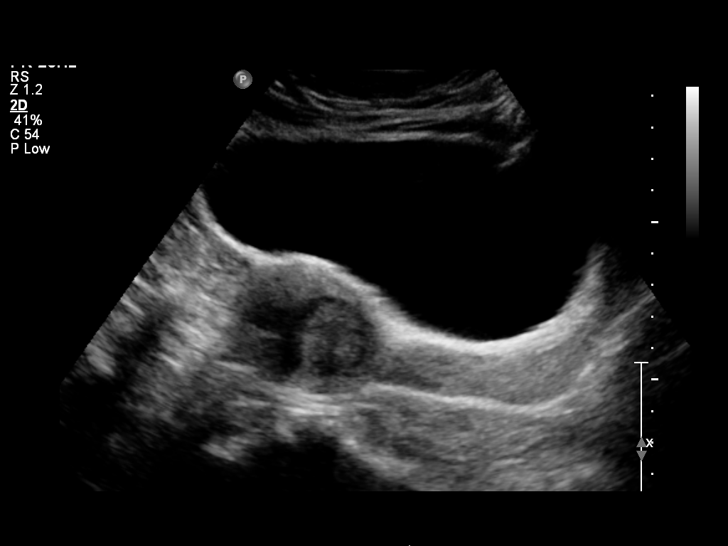
[im 5/58]
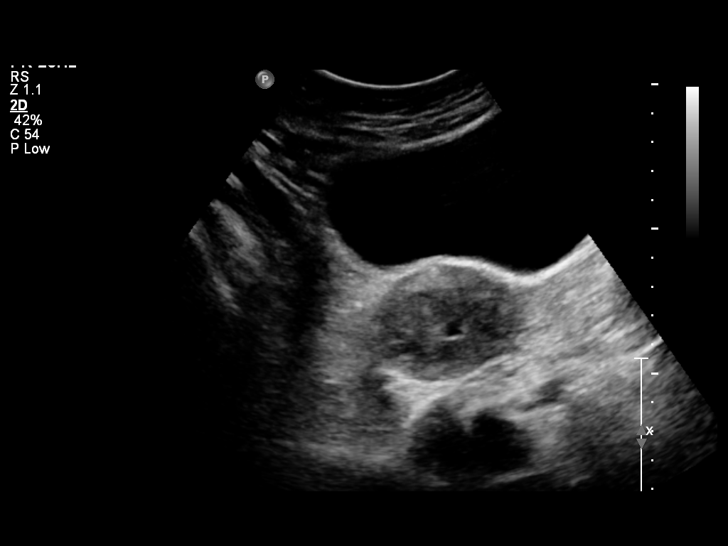
[im 10/58]
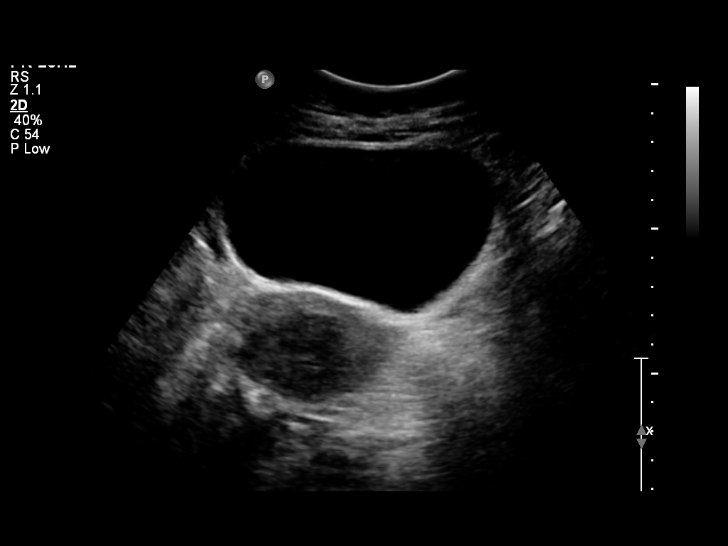
[im 15/58]
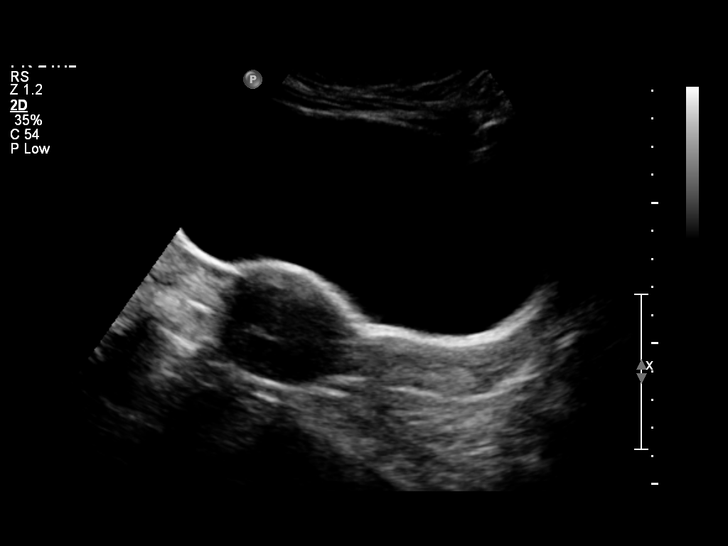
[im 20/58]
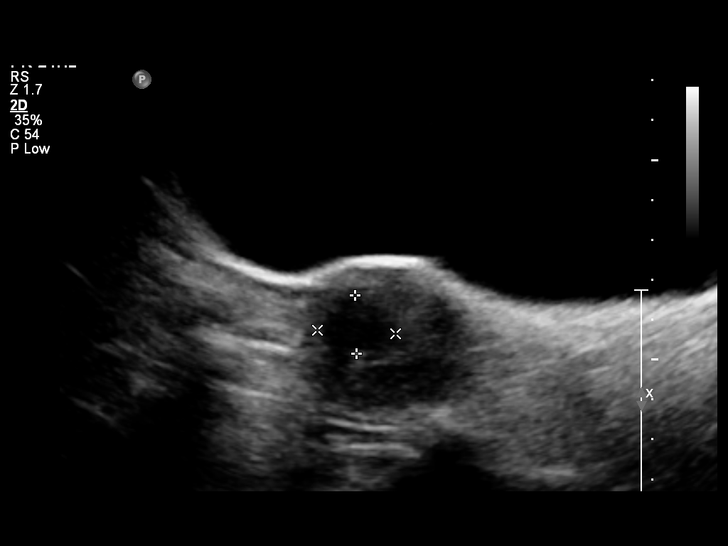
[im 24/58]
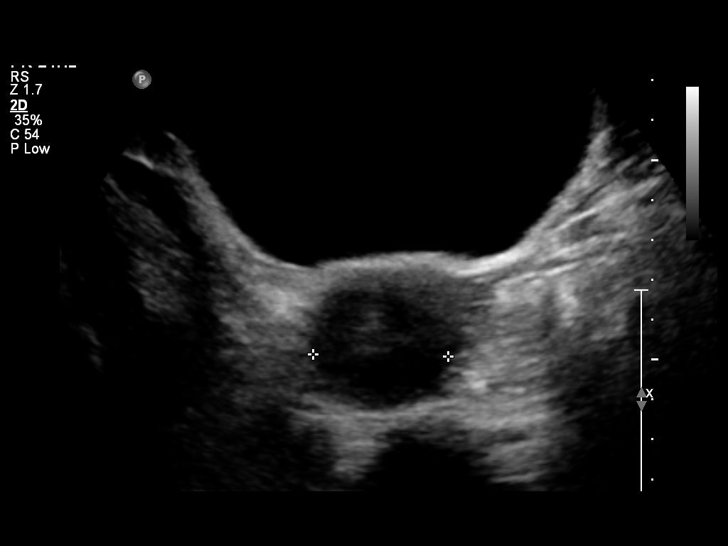
[im 29/58]
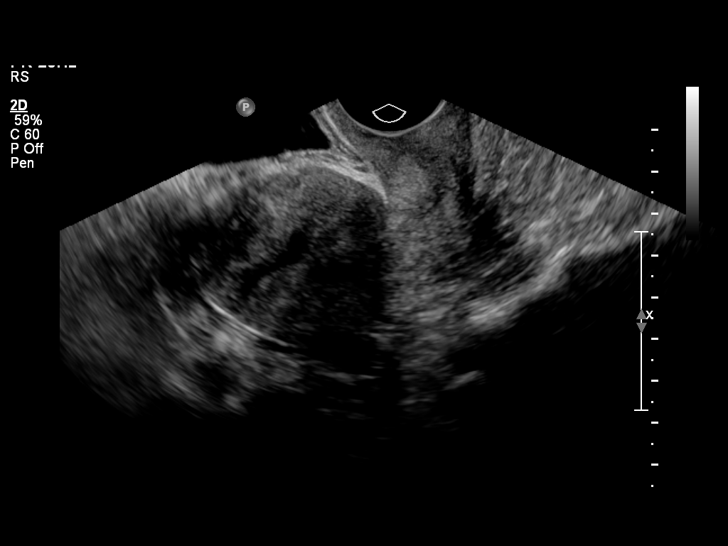
[im 34/58]
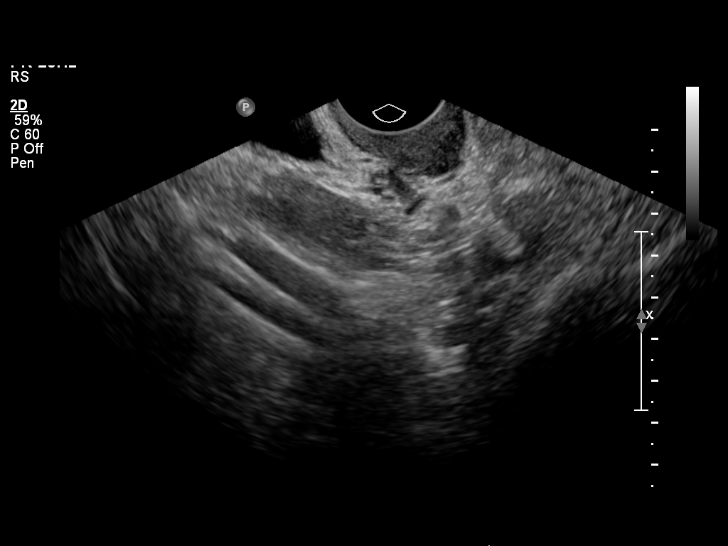
[im 39/58]
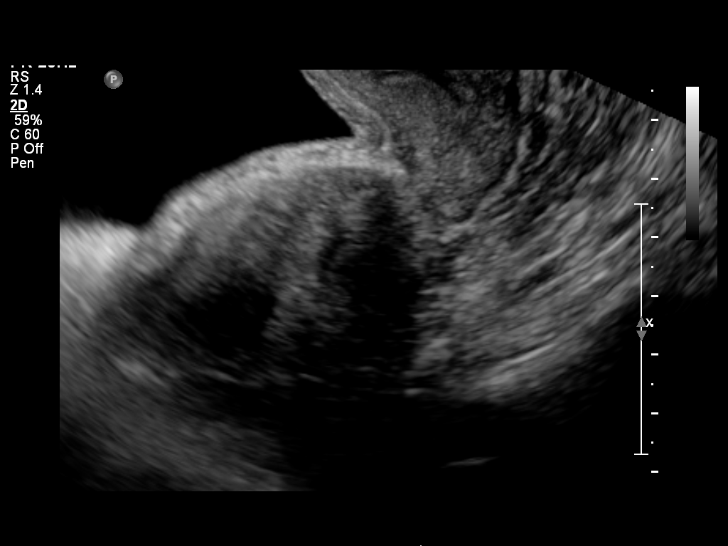
[im 43/58]
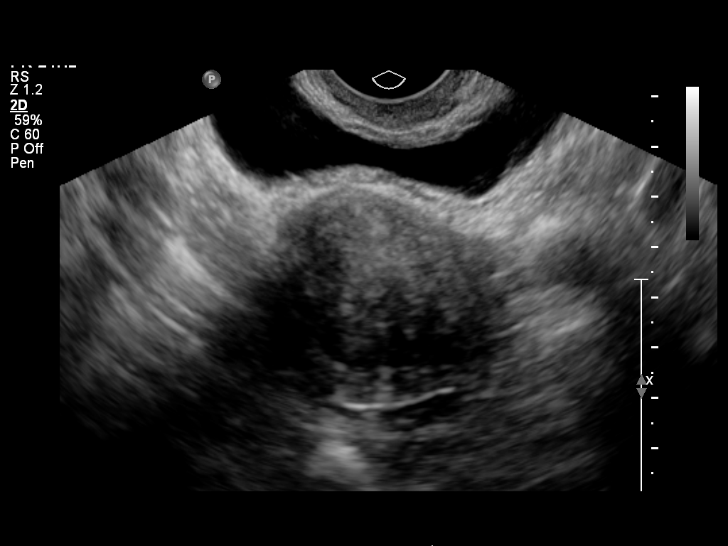
[im 48/58]
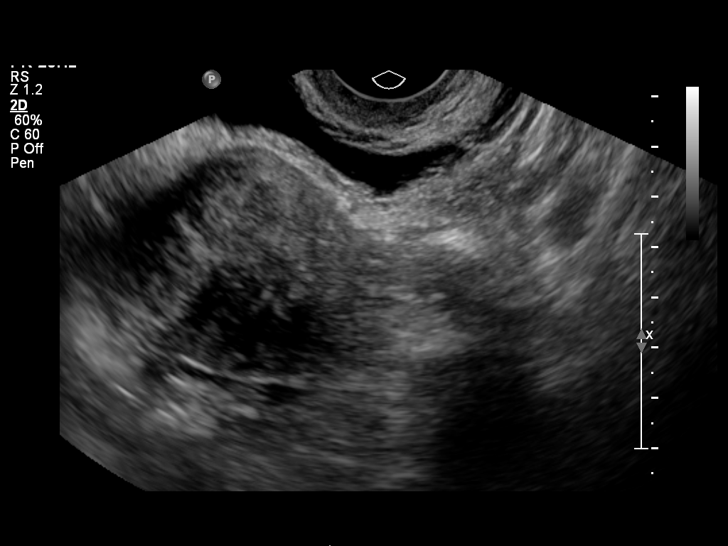
[im 53/58]
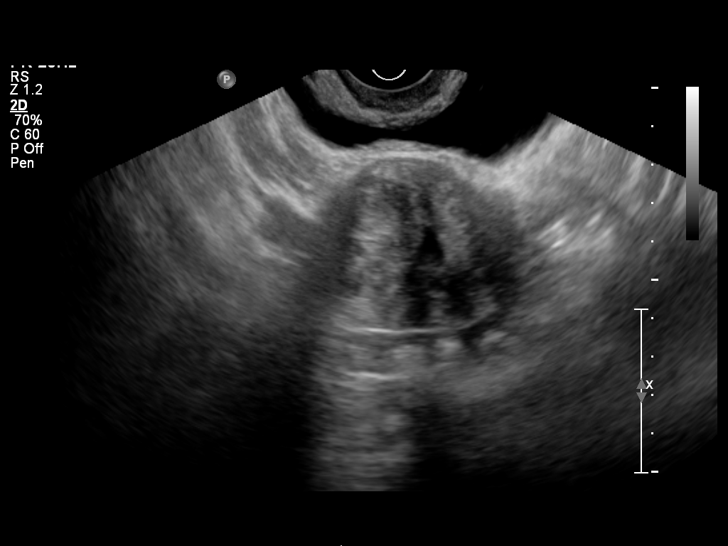
[im 58/58]
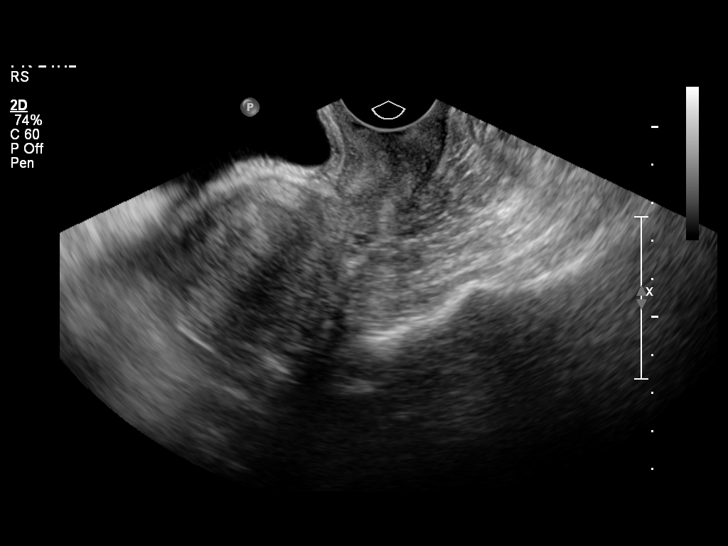

[13 of 25 positions shown; findings below may reference images not displayed]

FINDINGS: Uterus:  10.3 x 3.8 x 5.2 cm.  Several small uterine fibroids are
seen, three of which are measurable.  These range in size from
cm to 3.4 cm in maximum diameter and are located in the uterine
corpus and fundus.

Endometrium: Poor visualization due to shadowing from fibroids as
well as poor definition of the endometrial - myometrial junction.
Small amount of fluid noted in the endometrial cavity.  Endometrial
thickness cannot be accurately measured.

Right ovary: not directly visualized by transabdominal or
transvaginal sonography, however no adnexal mass identified.

Left ovary: 2.4 x 1.4 x 1.7 cm.  Normal appearance.  No adnexal
mass identified.

Other Findings:  No free fluid
IMPRESSION: 1.  Several small uterine fibroids, largest measuring 3.4 cm.
2.  Small amount of fluid noted in the endometrial cavity.  Poor
visualization of the endometrial and myometrial junction, with
inability to accurately measure endometrial thickness.  In the
setting postmenopausal bleeding, endometrial sampling should be
considered.
3.  No adnexal mass or free fluid identified.

## 2014-06-02 ENCOUNTER — Ambulatory Visit (HOSPITAL_COMMUNITY)
Admission: RE | Admit: 2014-06-02 | Discharge: 2014-06-02 | Disposition: A | Discharge status: Home / Self Care | Payer: Self-pay | Source: Ambulatory Visit | Attending: Internal Medicine | Admitting: Internal Medicine

## 2014-06-02 DIAGNOSIS — M79609 Pain in unspecified limb: Secondary | ICD-10-CM | POA: Insufficient documentation

## 2014-06-02 DIAGNOSIS — M7989 Other specified soft tissue disorders: Secondary | ICD-10-CM | POA: Insufficient documentation

## 2014-06-02 DIAGNOSIS — R609 Edema, unspecified: Secondary | ICD-10-CM

## 2014-06-02 NOTE — Progress Notes (Addendum)
*  PRELIMINARY RESULTS* Vascular Ultrasound Lower extremity venous duplex has been completed.  Preliminary findings: No evidence of DVT or baker's cyst.  Paged Dr. Naaman Plummer at 10:43am for prelim results. No call back as of 11:00am, let patient leave since negative.   Landry Mellow, RDMS, RVT  06/02/2014, 10:45 AM

## 2014-07-14 ENCOUNTER — Other Ambulatory Visit: Payer: Self-pay | Admitting: *Deleted

## 2014-07-14 DIAGNOSIS — M797 Fibromyalgia: Secondary | ICD-10-CM

## 2014-07-15 MED ORDER — CELECOXIB 100 MG PO CAPS: 100.0000 mg | ORAL_CAPSULE | Freq: Two times a day (BID) | ORAL | Status: DC

## 2014-07-16 NOTE — Telephone Encounter (Signed)
Rx called in to pharmacy. 

## 2014-07-27 ENCOUNTER — Other Ambulatory Visit: Payer: Self-pay | Admitting: *Deleted

## 2014-07-27 NOTE — Telephone Encounter (Signed)
Refill for flexeril, was d/c on 6/17 Pharmacy informed

## 2014-08-05 ENCOUNTER — Encounter: Payer: Self-pay | Admitting: Obstetrics and Gynecology

## 2014-08-05 ENCOUNTER — Ambulatory Visit (INDEPENDENT_AMBULATORY_CARE_PROVIDER_SITE_OTHER): Payer: No Typology Code available for payment source | Admitting: Obstetrics and Gynecology

## 2014-08-05 VITALS — BP 132/68 | HR 63 | Ht 65.0 in | Wt 214.6 lb

## 2014-08-05 DIAGNOSIS — G8929 Other chronic pain: Secondary | ICD-10-CM

## 2014-08-05 DIAGNOSIS — N949 Unspecified condition associated with female genital organs and menstrual cycle: Secondary | ICD-10-CM

## 2014-08-05 DIAGNOSIS — R102 Pelvic and perineal pain: Principal | ICD-10-CM

## 2014-08-05 NOTE — Addendum Note (Signed)
Addended by: Novella Olive on: 08/05/2014 03:52 PM   Modules accepted: Orders

## 2014-08-05 NOTE — Progress Notes (Signed)
Patient ID: Carolyn Wall, female   DOB: 1956/01/23, 58 y.o.   MRN: [id number] 59 yo G1P1 presenting today for evaluation of pelvic pain which she attributes to her fibroid uterus. Patient has been postmenopausal since 2010. She reports [name] pelvic pain in the suprapubic region which worsen with bowel movement. Patient reports normal colonoscopy in recent years.  Past Medical History  Diagnosis Date  . Hypertension   . Fibromyalgia   . Depression   . Heavy menstrual bleeding     blood transfusion 1/ 2009  . Arthritis   . Lumbar herniated disc   . GERD (gastroesophageal reflux disease)    Past Surgical History  Procedure Laterality Date  . Endometrial ablation    . Cholecystectomy    . Esophageal dilation    . Cesarean section    . Mouth surgery    . Colonoscopy  08/29/2012    Procedure: COLONOSCOPY;  Surgeon: Wonda Horner, MD;  Location: [facility];  Service: Endoscopy;  Laterality: N/A;  dr. will bring h&p   Family History  Problem Relation Age of Onset  . Cancer Maternal Aunt     breast  . Heart attack Maternal Grandmother   . Heart attack Maternal Grandfather    History  Substance Use Topics  . Smoking status: Never Smoker   . Smokeless tobacco: Never Used  . Alcohol Use: No   GENERAL: Well-developed, well-nourished female in no acute distress.  ABDOMEN: Soft, mild suprabupic tenderness, nondistended. No organomegaly. PELVIC: Normal external female genitalia. Vagina is pink and rugated.  Normal discharge. Normal appearing cervix. Uterus is normal in size. No adnexal mass or tenderness. EXTREMITIES: No cyanosis, clubbing, or edema, 2+ distal pulses.  A/P 58 yo with chronic pelvic pain - 2013 pelvic ultrasound reviewed. Unlikely the source of her pain as fibroids were smaller than in 2010. Another ultrasound ordered today - wet prep ordered - referral to urology provided as patient did not follow up last year - patient will be contacted with abnormal results

## 2014-08-05 NOTE — Progress Notes (Signed)
Referral appt to [facility] on 10/16 @ 1345 w/Dr. Burnett Harry.  [phone].  Pt aware that she needs to bring $250 to appt.  Pelvic/trans vag [place] scheduled on 08/12/14 @ 1515.

## 2014-08-06 ENCOUNTER — Other Ambulatory Visit: Payer: Self-pay | Admitting: Internal Medicine

## 2014-08-06 LAB — WET PREP, GENITAL
Clue Cells Wet Prep HPF POC: NONE SEEN
TRICH WET PREP: NONE SEEN
WBC WET PREP: NONE SEEN
Yeast Wet Prep HPF POC: NONE SEEN

## 2014-08-06 LAB — URINE CULTURE
COLONY COUNT: NO GROWTH
Organism ID, Bacteria: NO GROWTH

## 2014-08-12 ENCOUNTER — Ambulatory Visit (HOSPITAL_COMMUNITY): Payer: No Typology Code available for payment source

## 2014-08-21 ENCOUNTER — Encounter: Payer: Self-pay | Admitting: Internal Medicine

## 2014-08-21 ENCOUNTER — Ambulatory Visit (INDEPENDENT_AMBULATORY_CARE_PROVIDER_SITE_OTHER): Payer: Self-pay | Admitting: Internal Medicine

## 2014-08-21 VITALS — BP 145/63 | HR 56 | Temp 98.1°F | Wt 216.7 lb

## 2014-08-21 DIAGNOSIS — M19019 Primary osteoarthritis, unspecified shoulder: Secondary | ICD-10-CM

## 2014-08-21 DIAGNOSIS — F419 Anxiety disorder, unspecified: Secondary | ICD-10-CM

## 2014-08-21 DIAGNOSIS — E559 Vitamin D deficiency, unspecified: Secondary | ICD-10-CM

## 2014-08-21 DIAGNOSIS — M160 Bilateral primary osteoarthritis of hip: Secondary | ICD-10-CM

## 2014-08-21 DIAGNOSIS — F329 Major depressive disorder, single episode, unspecified: Secondary | ICD-10-CM

## 2014-08-21 DIAGNOSIS — R3915 Urgency of urination: Secondary | ICD-10-CM

## 2014-08-21 DIAGNOSIS — M797 Fibromyalgia: Secondary | ICD-10-CM

## 2014-08-21 DIAGNOSIS — Z0000 Encounter for general adult medical examination without abnormal findings: Secondary | ICD-10-CM

## 2014-08-21 DIAGNOSIS — Z23 Encounter for immunization: Secondary | ICD-10-CM

## 2014-08-21 DIAGNOSIS — R32 Unspecified urinary incontinence: Secondary | ICD-10-CM

## 2014-08-21 DIAGNOSIS — F418 Other specified anxiety disorders: Secondary | ICD-10-CM

## 2014-08-21 DIAGNOSIS — I10 Essential (primary) hypertension: Secondary | ICD-10-CM

## 2014-08-21 DIAGNOSIS — F32A Depression, unspecified: Secondary | ICD-10-CM

## 2014-08-21 MED ORDER — SERTRALINE HCL 50 MG PO TABS: 50.0000 mg | ORAL_TABLET | Freq: Every day | ORAL | Status: AC

## 2014-08-21 MED ORDER — CYCLOBENZAPRINE HCL 10 MG PO TABS: 15.0000 mg | ORAL_TABLET | Freq: Two times a day (BID) | ORAL | Status: AC | PRN

## 2014-08-21 MED ORDER — LISINOPRIL 5 MG PO TABS: 5.0000 mg | ORAL_TABLET | Freq: Every day | ORAL | Status: DC

## 2014-08-21 MED ORDER — MELOXICAM 7.5 MG PO TABS: 7.5000 mg | ORAL_TABLET | Freq: Every day | ORAL | Status: AC | PRN

## 2014-08-21 NOTE — Patient Instructions (Addendum)
-  Start taking zoloft 50 mg daily instead of cymbalta for your mood and fibromyalgia -Keep taking lisinopril 5 mg daily in addition to HCTZ 12.5 mg daily for high blood pressure -Take mobic 7.5 mg as needed daily for back/pelvic pain  -Take flexiril 15 mg twice a day as needed for muscle spasms (take this at night as it may make yor drowsy). No longer take robaxin.  -Will give you a flu shot today -Will refer you to urology and check your bloodwork today -Will see you back in 6 weeks    General Instructions:   Please bring your medicines with you each time you come to clinic.  Medicines may include prescription medications, over-the-counter medications, herbal remedies, eye drops, vitamins, or other pills.   Progress Toward Treatment Goals:  Treatment Goal 03/06/2014  Blood pressure at goal    Self Care Goals & Plans:  Self Care Goal 03/06/2014  Manage my medications take my medicines as prescribed; bring my medications to every visit; refill my medications on time; follow the sick day instructions if I am sick  Monitor my health keep track of my blood pressure  Eat healthy foods eat more vegetables; eat fruit for snacks and desserts; eat baked foods instead of fried foods; eat foods that are low in salt; eat smaller portions; drink diet soda or water instead of juice or soda  Be physically active find an activity I enjoy  Other -  Meeting treatment goals maintain the current self-care plan    No flowsheet data found.   Care Management & Community Referrals:  Referral 03/06/2014  Referrals made for care management support none needed  Referrals made to community resources none

## 2014-08-21 NOTE — Progress Notes (Signed)
Patient ID: Carolyn Wall, female   DOB: 07/03/1956, 58 y.o.   MRN: [id number]    Subjective:   Patient ID: Carolyn Wall female   DOB: [birth year] 58 y.o.   MRN: [id number]  HPI: Carolyn Wall is a 58 y.o. pleasant woman with past medical history of hypertension, anxiety/depression, fibromyalgia, osteoarthritis, and GERD who presents for routine follow-up visit.   She has history of multiple small uterine fibroids (seen on pelvic US on 08/30/12) and is s/p ablation and negative endometrial biopsy. She was seen by women's clinic recently due chronic pelvic pain. Initial workup was negative (UA, wet mount). She was scheduled to have a repeat pelvic US and to be referred to urology for further evaluation of chronic urinary urgency. She would like to referral to urology first before having the US performed due to suprabpubic tenderness. She was to follow-up about one year ago with urology but was not able to do so. She reports urinary urgency with occasional leakage with no relation to coughing, straining, or sneezing. She is not wearing adult diapers. She denies vaginal bleeding or other urinary symptoms. She has not been on anti-cholinergic therapy in the past.   She reports her mood is unchanged and has not seen improvement with taking cymbalta for the past year which causes her GI upset (loose stools) and mental fogginess. She would like to try an alternative anti-depressant. She continues to have whole body pain at trigger point sites with generalized fatigue, insomnia, and hot flashes. She tries to exercise as much as she can but limited in her activities due to chronic low back pain and right hip pain with right sided sciatica. She is currently on celecoxib which helps and would like to try mobic again. She was prescribed methocarbamol at last for muscle spasms which did not help. She would like to return to taking flexiril instead.   She is compliant with taking HCTZ for hypertension and  recently started taking lisinopril 5 mg daily due to elevated blood pressures at home. She has chronic headaches and at times blurry vision and lightheadedness but denies chest pain or LE swelling.   She has history of vitamin D insufficiency and is compliant with taking vitamin D (1000 U) supplements daily. She denies recent fall or fracture.    She would like to have flu shot today. She denies fever, chills, or recent illness.      Past Medical History  Diagnosis Date  . Hypertension   . Fibromyalgia   . Depression   . Heavy menstrual bleeding     blood transfusion 1/ 2009  . Arthritis   . Lumbar herniated disc   . GERD (gastroesophageal reflux disease)    Current Outpatient Prescriptions  Medication Sig Dispense Refill  . acetaminophen (TYLENOL) 500 MG tablet Take 500 mg by mouth every 6 (six) hours as needed.      . celecoxib (CELEBREX) 100 MG capsule Take 1 capsule (100 mg total) by mouth 2 (two) times daily.  60 capsule  3  . cholecalciferol (VITAMIN D) 1000 UNITS tablet Take 1 tablet (1,000 Units total) by mouth daily.  30 tablet  3  . DULoxetine (CYMBALTA) 30 MG capsule Take 1 capsule (30 mg total) by mouth 2 (two) times daily.  30 capsule  3  . esomeprazole (NEXIUM) 40 MG capsule Take 40 mg by mouth daily before breakfast.      . hydrochlorothiazide (MICROZIDE) 12.5 MG capsule TAKE ONE CAPSULE BY MOUTH ONCE DAILY  30 capsule  5  . lisinopril (PRINIVIL,ZESTRIL) 5 MG tablet Take 5 mg by mouth daily.      . methocarbamol (ROBAXIN) 500 MG tablet Take 2 tablets (1,000 mg total) by mouth 4 (four) times daily as needed for muscle spasms.  120 tablet  0  . senna-docusate (SENOKOT-S) 8.6-50 MG per tablet Take 2 tablets by mouth daily as needed for mild constipation.  30 tablet  1   No current facility-administered medications for this visit.   Family History  Problem Relation Age of Onset  . Cancer Maternal Aunt     breast  . Heart attack Maternal Grandmother   . Heart attack  Maternal Grandfather    History   Social History  . Marital Status: Divorced    Spouse Name: N/A    Number of Children: N/A  . Years of Education: N/A   Social History Main Topics  . Smoking status: Never Smoker   . Smokeless tobacco: Never Used  . Alcohol Use: No  . Drug Use: No  . Sexual Activity: No   Other Topics Concern  . Not on file   Social History Narrative  . No narrative on file   Review of Systems: Review of Systems  Constitutional: Positive for malaise/fatigue (chronic). Negative for fever, chills and weight loss.  Eyes: Positive for blurred vision (chronic).  Respiratory: Negative for cough and shortness of breath.   Cardiovascular: Negative for chest pain and leg swelling.  Gastrointestinal: Positive for abdominal pain (chronic lower/suprapubic) and diarrhea (with cymbalta). Negative for nausea, vomiting, constipation and blood in stool.  Genitourinary: Positive for urgency (chronic). Negative for dysuria, frequency and hematuria.  Musculoskeletal: Positive for back pain (chronic low back), joint pain (right hip) and myalgias (chronic at trigger point sites).  Neurological: Positive for dizziness, sensory change (right sided sciatica ) and headaches. Negative for focal weakness.  Psychiatric/Behavioral: Positive for depression. The patient has insomnia.     Objective:  Physical Exam: Filed Vitals:   08/21/14 1426  BP: 145/63  Pulse: 56  Temp: 98.1 F (36.7 C)  TempSrc: Oral  Weight: 216 lb 11.2 oz (98.294 kg)  SpO2: 99%   Physical Exam  Constitutional: She is oriented to person, place, and time. She appears well-developed and well-nourished. No distress.  HENT:  Head: Normocephalic and atraumatic.  Right Ear: External ear normal.  Left Ear: External ear normal.  Nose: Nose normal.  Mouth/Throat: Oropharynx is clear and moist.  Eyes: Conjunctivae and EOM are normal. Pupils are equal, round, and reactive to light. Right eye exhibits no discharge.  Left eye exhibits no discharge. No scleral icterus.  Neck: Normal range of motion. Neck supple.  Cardiovascular: Normal rate, regular rhythm and normal heart sounds.   Pulmonary/Chest: Effort normal and breath sounds normal. No respiratory distress. She has no wheezes. She has no rales.  Abdominal: Soft. Bowel sounds are normal. She exhibits no distension. There is tenderness (lower abominal/suprapubic). There is no rebound and no guarding.  Musculoskeletal: Normal range of motion. She exhibits no edema and no tenderness.  Neurological: She is alert and oriented to person, place, and time.  Normal 5/5 muscle strength throughout. Normal sensation of extremities to light touch.   Skin: Skin is warm and dry. No rash noted. She is not diaphoretic. No erythema. No pallor.  Psychiatric: Her behavior is normal. Judgment and thought content normal.  Anxious    Assessment & Plan:   Please see problem list for problem-based assessment and plan

## 2014-08-22 LAB — BASIC METABOLIC PANEL WITH GFR
BUN: 15 mg/dL (ref 6–23)
CO2: 27 meq/L (ref 19–32)
Calcium: 9.1 mg/dL (ref 8.4–10.5)
Chloride: 104 mEq/L (ref 96–112)
Creat: 0.67 mg/dL (ref 0.50–1.10)
GFR, Est African American: >89 mL/min
GFR, Est Non African American: >89 mL/min
GLUCOSE: 100 mg/dL — AB (ref 70–99)
POTASSIUM: 3.6 meq/L (ref 3.5–5.3)
Sodium: 142 mEq/L (ref 135–145)

## 2014-08-22 LAB — VITAMIN D 25 HYDROXY (VIT D DEFICIENCY, FRACTURES): Vit D, 25-Hydroxy: 38 ng/mL (ref 30–89)

## 2014-08-23 DIAGNOSIS — R3915 Urgency of urination: Secondary | ICD-10-CM | POA: Insufficient documentation

## 2014-08-23 NOTE — Assessment & Plan Note (Addendum)
Assessment: Pt with moderately well-controlled fibromyalgia compliant with anti-depressant, NSAID, and muscle relaxant therapy who presents with uncontrolled pain.    Plan:  -Discontinue cymbalta due to GI upset and ineffectiveness and prescribe sertraline 50 mg daily. Pt to return in 6 weeks for further adjustment. -Prescribe meloxicam 7.5 mg daily PRN pain and continue celecoxib 100 mg BID  -Discontinue methocarbamol due to ineffectiveness and prescribe flexiril 15 mg BID PRN muscle spasms  -Continue graded exercise therapy  -Consider referral to pain clinic if pain continues to be uncontrolled

## 2014-08-23 NOTE — Assessment & Plan Note (Signed)
Assessment: Pt with history of vitamin insufficiency compliant with vitamin D replacement therapy who presents with no recent fall or fracture.    Plan:  -Obtain 25-OH vitamin D level ---> improved to 38 from 28 -Continue vitamin D3 1000 U daily  -Obtain intact PTH at next visit (to r/o coexisting primary hyperparathyrodism)

## 2014-08-23 NOTE — Assessment & Plan Note (Addendum)
Assessment: Patient with moderately well-controlled hypertension compliant with two-class (diuretic & ACEi) anti-hypertensive therapy who presents with blood pressure of 145/63.   Plan:  -BP 145/63 not atgoal of <140/90  -Continue HCTZ 12.5 mg daily and lisinopril 5 mg daily, consider increasing to 10 mg daily if continues to be elevated at next visit   -Obtain BMP ---> normal

## 2014-08-23 NOTE — Assessment & Plan Note (Addendum)
Assessment: Pt with moderately controlled anxiety and depression compliant with antidepressant (SNRI) therapy who presents with no improvement in symptoms.   Plan:  -Discontinue cymbalta due to GI upset and ineffectiveness -Prescribe sertraline 50 mg daily -To return in 6 weeks for further adjustment

## 2014-08-23 NOTE — Assessment & Plan Note (Addendum)
Assessment: Pt with history of osteoarthritis of acromioclavicular joint, bilateral hips (R>L), facet degenerative disease in the lower lumbar spine, and reduced anterior translation of the right mandibular condyle who presents with persistent low back pain and right hip pain with no alarm symptoms.   Plan:  -Prescribe meloxicam 7.5 mg PRN pain and continue celecoxib 100 mg BID  -Continue graded exercise therapy

## 2014-08-23 NOTE — Assessment & Plan Note (Addendum)
Assessment: Pt with chronic urinary urgency with occasional leakage not in setting of stress most likely due to urge incontinence.     Plan:  -Refer to urology for further evaluation (per pt request) -Consider trial of anticholingeric therapy at next visit

## 2014-08-23 NOTE — Assessment & Plan Note (Signed)
-  Pt received annual influenza vaccination today on 08/21/14.  -Pt due for annual mammography 10/2014

## 2014-08-24 NOTE — Progress Notes (Signed)
Internal Medicine Clinic Attending  Case discussed with Dr. Rabbani soon after the resident saw the patient.  We reviewed the resident's history and exam and pertinent patient test results.  I agree with the assessment, diagnosis, and plan of care documented in the resident's note.  

## 2014-09-14 ENCOUNTER — Encounter: Payer: Self-pay | Admitting: Internal Medicine

## 2014-10-02 ENCOUNTER — Encounter: Payer: Self-pay | Admitting: Internal Medicine

## 2014-10-02 ENCOUNTER — Ambulatory Visit (INDEPENDENT_AMBULATORY_CARE_PROVIDER_SITE_OTHER): Payer: Self-pay | Admitting: Internal Medicine

## 2014-10-02 VITALS — BP 133/70 | HR 53 | Temp 98.2°F | Wt 212.4 lb

## 2014-10-02 DIAGNOSIS — F418 Other specified anxiety disorders: Secondary | ICD-10-CM

## 2014-10-02 DIAGNOSIS — F419 Anxiety disorder, unspecified: Principal | ICD-10-CM

## 2014-10-02 DIAGNOSIS — M150 Primary generalized (osteo)arthritis: Secondary | ICD-10-CM

## 2014-10-02 DIAGNOSIS — F329 Major depressive disorder, single episode, unspecified: Secondary | ICD-10-CM

## 2014-10-02 DIAGNOSIS — M159 Polyosteoarthritis, unspecified: Secondary | ICD-10-CM

## 2014-10-02 DIAGNOSIS — R3915 Urgency of urination: Secondary | ICD-10-CM

## 2014-10-02 DIAGNOSIS — M797 Fibromyalgia: Secondary | ICD-10-CM

## 2014-10-02 DIAGNOSIS — F32A Depression, unspecified: Secondary | ICD-10-CM

## 2014-10-02 DIAGNOSIS — I10 Essential (primary) hypertension: Secondary | ICD-10-CM

## 2014-10-02 DIAGNOSIS — J309 Allergic rhinitis, unspecified: Secondary | ICD-10-CM

## 2014-10-02 MED ORDER — CETIRIZINE HCL 10 MG PO TABS: 10.0000 mg | ORAL_TABLET | Freq: Every day | ORAL | Status: AC

## 2014-10-02 MED ORDER — SALINE SPRAY 0.65 % NA SOLN: 1.0000 | Freq: Every day | NASAL | Status: AC

## 2014-10-02 MED ORDER — FLUTICASONE PROPIONATE 50 MCG/ACT NA SUSP: 2.0000 | Freq: Every day | NASAL | Status: AC

## 2014-10-02 MED ORDER — TRAMADOL HCL 50 MG PO TABS: 50.0000 mg | ORAL_TABLET | Freq: Two times a day (BID) | ORAL | Status: AC | PRN

## 2014-10-02 NOTE — Patient Instructions (Addendum)
-  Start taking zyrtec 10 mg daily and flonase spray in each nostril daily for allergies. Also use saline nasal spray daily.  -Take tramadol twice a day as needed for pain -Start taking zoloft 50 mg daily and schedule a follow-up visit after taking it for 6-8 weeks -Hopefully you will hear soon about your urology appointment  -So nice seeing you today and so sorry about your mother's passing :(  General Instructions:   Please bring your medicines with you each time you come to clinic.  Medicines may include prescription medications, over-the-counter medications, herbal remedies, eye drops, vitamins, or other pills.   Progress Toward Treatment Goals:  Treatment Goal 03/06/2014  Blood pressure at goal    Self Care Goals & Plans:  Self Care Goal 03/06/2014  Manage my medications take my medicines as prescribed; bring my medications to every visit; refill my medications on time; follow the sick day instructions if I am sick  Monitor my health keep track of my blood pressure  Eat healthy foods eat more vegetables; eat fruit for snacks and desserts; eat baked foods instead of fried foods; eat foods that are low in salt; eat smaller portions; drink diet soda or water instead of juice or soda  Be physically active find an activity I enjoy  Other -  Meeting treatment goals maintain the current self-care plan    No flowsheet data found.   Care Management & Community Referrals:  Referral 03/06/2014  Referrals made for care management support none needed  Referrals made to community resources none

## 2014-10-02 NOTE — Progress Notes (Signed)
Patient ID: Carolyn Wall, female   DOB: [birth year], 58 y.o.   MRN: [id number]    Subjective:   Patient ID: Carolyn Wall female   DOB: 1956/07/26 58 y.o.   MRN: [id number]  HPI: Carolyn Wall is a 58 y.o.  pleasant woman with past medical history of hypertension, anxiety/depression, fibromyalgia, osteoarthritis, and GERD who presents for follow-up visit of depression and fibromyalgia.   She reports that she was unable to acquire zoloft that I prescribed at last visit but is going to go to the pharmacy today. Her mother passed away recently and she has been coping with the loss. She has understandable grief and depression.  She continues to have whole body pain at trigger point sites from her fibromyalgia with chronic low back pain and right hip pain with right sided sciatica from osteoarthritis. At last visit her methocarbamol was changed back to flexiril and mobic was started in addition to celecoxib with no real improvement. She has tried tramadol in the past and would like to try it again.   She has been having recent sinus headache, nasal congestion, rhinorrhea, sneezing, itchy/watery/red eyes, and PND. She has possible history of perennial allergic rhinitis. She denies fever, chills, sinus tenderness, sore throat, ear pain/discharge, or recent sick contacts. She does not use OTC allergy medication or nasal spray. She has never had allergy testing before and unsure of her triggers.   She has history of multiple small uterine fibroids (seen on pelvic US on 08/30/12) and is s/p ablation and negative endometrial biopsy. She has chronic lower abdominal pain and urinary urgency with occasional leakage. She is waiting to hear from urology for an appointment for further evaluation. She denies vaginal bleeding or other urinary symptoms. She has not been on anti-cholinergic therapy in the past and declines starting it until she follows up with urology.   She is compliant with taking HCTZ  and  lisinopril for hypertension. She has chronic headaches and occasionally blurry vision and lightheadedness but denies chest pain or LE swelling.     Past Medical History  Diagnosis Date  . Hypertension   . Fibromyalgia   . Depression   . Heavy menstrual bleeding     blood transfusion 1/ 2009  . Arthritis   . Lumbar herniated disc   . GERD (gastroesophageal reflux disease)    Current Outpatient Prescriptions  Medication Sig Dispense Refill  . acetaminophen (TYLENOL) 500 MG tablet Take 500 mg by mouth every 6 (six) hours as needed.    . celecoxib (CELEBREX) 100 MG capsule Take 1 capsule (100 mg total) by mouth 2 (two) times daily. 60 capsule 3  . cholecalciferol (VITAMIN D) 1000 UNITS tablet Take 1 tablet (1,000 Units total) by mouth daily. 30 tablet 3  . cyclobenzaprine (FLEXERIL) 10 MG tablet Take 1.5 tablets (15 mg total) by mouth 2 (two) times daily as needed for muscle spasms. 90 tablet 2  . esomeprazole (NEXIUM) 40 MG capsule Take 40 mg by mouth daily before breakfast.    . hydrochlorothiazide (MICROZIDE) 12.5 MG capsule TAKE ONE CAPSULE BY MOUTH ONCE DAILY 30 capsule 5  . lisinopril (PRINIVIL,ZESTRIL) 5 MG tablet Take 1 tablet (5 mg total) by mouth daily. 30 tablet 3  . meloxicam (MOBIC) 7.5 MG tablet Take 1 tablet (7.5 mg total) by mouth daily as needed for pain. 30 tablet 3  . senna-docusate (SENOKOT-S) 8.6-50 MG per tablet Take 2 tablets by mouth daily as needed for mild constipation. 30 tablet 1  . sertraline (ZOLOFT)  50 MG tablet Take 1 tablet (50 mg total) by mouth daily. 30 tablet 2   No current facility-administered medications for this visit.   Family History  Problem Relation Age of Onset  . Cancer Maternal Aunt     breast  . Heart attack Maternal Grandmother   . Heart attack Maternal Grandfather    History   Social History  . Marital Status: Divorced    Spouse Name: N/A    Number of Children: N/A  . Years of Education: N/A   Social History Main Topics  .  Smoking status: Never Smoker   . Smokeless tobacco: Never Used  . Alcohol Use: No  . Drug Use: No  . Sexual Activity: No   Other Topics Concern  . Not on file   Social History Narrative   Review of Systems: Review of Systems  Constitutional: Positive for weight loss (4 lb since last visit) and malaise/fatigue (chronic). Negative for fever and chills.  HENT: Positive for congestion. Negative for ear discharge, ear pain, sore throat and tinnitus.        Sneezing, rhinorrhea, and PND  Eyes: Positive for blurred vision (chronic).       Red, itchy, watery eyes   Respiratory: Negative for cough, shortness of breath and wheezing.   Cardiovascular: Negative for chest pain and leg swelling.  Gastrointestinal: Positive for abdominal pain (chronic lower ) and constipation. Negative for nausea, vomiting and diarrhea.  Genitourinary: Positive for urgency (chronic). Negative for dysuria, frequency and hematuria.  Musculoskeletal: Positive for myalgias (chronic at trigger point sites from fibromyalgia), back pain (chronic low bacl) and joint pain (chronic right hip ).  Neurological: Positive for sensory change (chronic right sided sciatica) and headaches (chronic). Negative for dizziness.  Endo/Heme/Allergies: Positive for environmental allergies.  Psychiatric/Behavioral: Positive for depression.     Objective:  Physical Exam: Filed Vitals:   10/02/14 1457  BP: 133/70  Pulse: 53  Temp: 98.2 F (36.8 C)  TempSrc: Oral  Weight: 212 lb 6.4 oz (96.344 kg)  SpO2: 100%     Physical Exam  Constitutional: She is oriented to person, place, and time. She appears well-developed and well-nourished. No distress.  HENT:  Head: Normocephalic and atraumatic.  Nose: Nose normal.  Mouth/Throat: Oropharynx is clear and moist. No oropharyngeal exudate.  Wax buildup in both ears. No sinus tenderness.  Eyes: Conjunctivae and EOM are normal. Pupils are equal, round, and reactive to light. Right eye  exhibits no discharge. Left eye exhibits no discharge. No scleral icterus.  Neck: Normal range of motion. Neck supple.  Cardiovascular: Regular rhythm and normal heart sounds.   Bradycardic  Pulmonary/Chest: Effort normal and breath sounds normal. No respiratory distress. She has no wheezes. She has no rales.  Abdominal: Soft. Bowel sounds are normal. She exhibits no distension. There is tenderness (llower abdominal). There is no rebound and no guarding.  Musculoskeletal: Normal range of motion. She exhibits no edema or tenderness.  Neurological: She is alert and oriented to person, place, and time.  Normal 5/5 muscle extremity strength. Normal sensation to light touch.   Skin: Skin is warm and dry. No rash noted. She is not diaphoretic. No erythema. No pallor.  Psychiatric: She has a normal mood and affect. Her behavior is normal. Judgment and thought content normal.    Assessment & Plan:   Please see problem list for problem-based assessment and plan

## 2014-10-03 DIAGNOSIS — J309 Allergic rhinitis, unspecified: Secondary | ICD-10-CM | POA: Insufficient documentation

## 2014-10-03 NOTE — Assessment & Plan Note (Addendum)
Assessment: Pt with history of osteoarthritis of acromioclavicular joint, bilateral hips (R>L), facet degenerative disease in the lower lumbar spine, and reduced anterior translation of the right mandibular condyle who presents with persistent low back pain and right hip pain with no alarm symptoms.   Plan:  -Prescribe tramadol 50 mg BID PRN pain   -Continue meloxicam 7.5 mg PRN pain and continue celecoxib 100 mg BID

## 2014-10-03 NOTE — Assessment & Plan Note (Signed)
Assessment: Pt with moderately controlled anxiety and depression unable to obtain SSRI therapy who presents with recent loss of her mother.   Plan:  -Pt reports she will obtain sertraline 50 mg daily today -Pt instructed to return in 6 weeks after she is on therapy for further adjustment

## 2014-10-03 NOTE — Assessment & Plan Note (Signed)
Assessment: Pt with moderately well-controlled fibromyalgia compliant with NSAID and muscle relaxant therapy who presents with no improvement in pain.   Plan:  -Pt reports she will obtain sertraline 50 mg daily today and return in 6 weeks for further adjustment -Continue meloxicam 7.5 mg daily PRN pain and celecoxib 100 mg BID  -Continue flexiril 15 mg BID PRN muscle spasms  -Prescribe tramadol 50 mg BID PRN pain   -Continue graded exercise therapy

## 2014-10-03 NOTE — Assessment & Plan Note (Signed)
Assessment: Patient with moderately well-controlled hypertension compliant with two-class (diuretic & ACEi) anti-hypertensive therapy who presents with blood pressure of 133/70.   Plan:  -BP 133/70 at goal of <140/90  -Continue HCTZ 12.5 mg daily and lisinopril 5 mg daily  -Last BMP on 08/21/14 was normal

## 2014-10-03 NOTE — Assessment & Plan Note (Signed)
Assessment: Pt with chronic urinary urgency and lower abdominal pain with occasional leakage not in setting of stress most likely due to urge incontinence.   Plan:  -Pt awaiting to hear from urology for an appointment for further evaluation  -Pt declined trial of anticholingeric therapy at this time -Consider bladder scan at next visit

## 2014-10-03 NOTE — Assessment & Plan Note (Addendum)
Assessment: Pt with probable perennial allergic rhinitis with no prior allergy testing and not currently on medical therapy who presents with symptoms of allergic rhinitis.    Plan:  -Prescribe cetirizine 10 mg daily  -Prescribe ocean nasal spray daily (1 spray in each nostril daily) and fluticasone 50 mcg nasal spray (2 sprays in each nostril daily) -Pt instructed to use mixture of peroxide and vinegar for ear wax buildup -Obtain IgE level at next visit and if no improvement consider montelukast

## 2014-10-05 NOTE — Progress Notes (Signed)
Medicine attending: Medical history, presenting complaints, physical findings, and medications, reviewed with resident physician Dr. Juluis Mire and I concur with her evaluation and management plan.

## 2014-10-06 ENCOUNTER — Encounter: Payer: Self-pay | Admitting: Pulmonary Disease

## 2014-10-06 ENCOUNTER — Ambulatory Visit (INDEPENDENT_AMBULATORY_CARE_PROVIDER_SITE_OTHER): Payer: Self-pay | Admitting: Pulmonary Disease

## 2014-10-06 ENCOUNTER — Ambulatory Visit (HOSPITAL_COMMUNITY)
Admission: RE | Admit: 2014-10-06 | Discharge: 2014-10-06 | Disposition: A | Discharge status: Home / Self Care | Payer: Self-pay | Source: Ambulatory Visit | Attending: Internal Medicine | Admitting: Internal Medicine

## 2014-10-06 VITALS — BP 123/57 | HR 54 | Temp 98.3°F | Wt 211.9 lb

## 2014-10-06 DIAGNOSIS — I10 Essential (primary) hypertension: Secondary | ICD-10-CM

## 2014-10-06 DIAGNOSIS — R001 Bradycardia, unspecified: Secondary | ICD-10-CM

## 2014-10-06 LAB — CBC WITH DIFFERENTIAL/PLATELET
BASOS PCT: 1 % (ref 0–1)
Basophils Absolute: 0.1 10*3/uL (ref 0.0–0.1)
EOS ABS: 0 10*3/uL (ref 0.0–0.7)
EOS PCT: 1 % (ref 0–5)
HEMATOCRIT: 36.7 % (ref 36.0–46.0)
Hemoglobin: 12.8 g/dL (ref 12.0–15.0)
Lymphocytes Relative: 49 % — ABNORMAL HIGH (ref 12–46)
Lymphs Abs: 1.5 10*3/uL (ref 0.7–4.0)
MCH: 29.6 pg (ref 26.0–34.0)
MCHC: 34.9 g/dL (ref 30.0–36.0)
MCV: 85 fL (ref 78.0–100.0)
MONOS PCT: 7 % (ref 3–12)
MPV: 10.3 fL (ref 9.4–12.4)
Monocytes Absolute: 0.2 10*3/uL (ref 0.1–1.0)
Neutro Abs: 1.3 10*3/uL — ABNORMAL LOW (ref 1.7–7.7)
Neutrophils Relative %: 42 % — ABNORMAL LOW (ref 43–77)
Platelets: 253 10*3/uL (ref 150–400)
RBC: 4.32 MIL/uL (ref 3.87–5.11)
RDW: 12.8 % (ref 11.5–15.5)
WBC: 3 10*3/uL — ABNORMAL LOW (ref 4.0–10.5)

## 2014-10-06 LAB — BASIC METABOLIC PANEL WITH GFR
BUN: 9 mg/dL (ref 6–23)
CO2: 30 mEq/L (ref 19–32)
CREATININE: 0.68 mg/dL (ref 0.50–1.10)
Calcium: 9.3 mg/dL (ref 8.4–10.5)
Chloride: 103 mEq/L (ref 96–112)
GFR, Est African American: >89 mL/min
GFR, Est Non African American: >89 mL/min
Glucose, Bld: 100 mg/dL — ABNORMAL HIGH (ref 70–99)
Potassium: 3.7 mEq/L (ref 3.5–5.3)
Sodium: 143 mEq/L (ref 135–145)

## 2014-10-06 LAB — TROPONIN I: Troponin I: <0.3 ng/mL (ref <0.30)

## 2014-10-06 LAB — TSH: TSH: 0.946 u[IU]/mL (ref 0.350–4.500)

## 2014-10-06 NOTE — Patient Instructions (Signed)
Please follow up as previously scheduled.  General Instructions:   Please bring your medicines with you each time you come to clinic.  Medicines may include prescription medications, over-the-counter medications, herbal remedies, eye drops, vitamins, or other pills.   Progress Toward Treatment Goals:  Treatment Goal 03/06/2014  Blood pressure at goal    Self Care Goals & Plans:  Self Care Goal 10/02/2014  Manage my medications take my medicines as prescribed; bring my medications to every visit; refill my medications on time  Monitor my health -  Eat healthy foods eat foods that are low in salt; eat baked foods instead of fried foods  Be physically active -  Other -  Meeting treatment goals -   Bradycardia Bradycardia is a term for a heart rate (pulse) that, in adults, is slower than 60 beats per minute. A normal rate is 60 to 100 beats per minute. A heart rate below 60 beats per minute may be normal for some adults with healthy hearts. If the rate is too slow, the heart may have trouble pumping the volume of blood the body needs. If the heart rate gets too low, blood flow to the brain may be decreased and may make you feel lightheaded, dizzy, or faint. The heart has a natural pacemaker in the top of the heart called the SA node (sinoatrial or sinus node). This pacemaker sends out regular electrical signals to the muscle of the heart, telling the heart muscle when to beat (contract). The electrical signal travels from the upper parts of the heart (atria) through the AV node (atrioventricular node), to the lower chambers of the heart (ventricles). The ventricles squeeze, pumping the blood from your heart to your lungs and to the rest of your body. CAUSES   Problem with the heart's electrical system.  Problem with the heart's natural pacemaker.  Heart disease, damage, or infection.  Medications.  Problems with minerals and salts (electrolytes). SYMPTOMS   Fainting  (syncope).  Fatigue and weakness.  Shortness of breath (dyspnea).  Chest pain (angina).  Drowsiness.  Confusion. SEEK IMMEDIATE MEDICAL CARE IF:   You feel lightheaded or faint.  You develop an irregular heart rate.  You feel chest pain or have trouble breathing. MAKE SURE YOU:   Understand these instructions.  Will watch your condition.  Will get help right away if you are not doing well or get worse.  Document Released: 07/22/2002 Document Revised: 01/22/2012 Document Reviewed: 02/04/2014 [facility] Patient Information 2015 [place], [place]. This information is not intended to replace advice given to you by your health care provider. Make sure you discuss any questions you have with your health care provider.

## 2014-10-06 NOTE — Progress Notes (Signed)
Subjective:    Patient ID: Carolyn Wall, female    DOB: February 17, 1956, 58 y.o.   MRN: [id number]  HPI Ms. Carolyn Wall is a 58 year old woman with history of HTN, depression, fibromyalgia presenting with concern about bradycardia.  She had her heart rate checked at her pharmacy this morning and was found to be in the low 50s. Her HR at last clinic visit was 49 on 10/02/2014. HR 54 today. Takes 50mg  of tramadol at night and Flexeril 10mg  at night. Reports she has been having fatigue with associated nausea. She also gets dizzy a couple times a week when she turns too quickly. Denies falls. Decreased exercise tolerance 2/2 chronic pain. Denied SOB from parking lot to clinic. Denies fevers/chills, presyncope, syncope, CP, emesis, diarrhea.  Review of Systems  Constitutional: Positive for fatigue. Negative for fever and chills.  Respiratory: Negative for shortness of breath.   Cardiovascular: Negative for chest pain.  Gastrointestinal: Positive for nausea. Negative for vomiting and diarrhea.  Neurological: Positive for dizziness and light-headedness. Negative for syncope.    Past Medical History  Diagnosis Date  . Hypertension   . Fibromyalgia   . Depression   . Heavy menstrual bleeding     blood transfusion 1/ 2009  . Arthritis   . Lumbar herniated disc   . GERD (gastroesophageal reflux disease)    Current Outpatient Prescriptions on File Prior to Visit  Medication Sig Dispense Refill  . acetaminophen (TYLENOL) 500 MG tablet Take 500 mg by mouth every 6 (six) hours as needed.    . celecoxib (CELEBREX) 100 MG capsule Take 1 capsule (100 mg total) by mouth 2 (two) times daily. 60 capsule 3  . cetirizine (ZYRTEC) 10 MG tablet Take 1 tablet (10 mg total) by mouth daily. 30 tablet 1  . cholecalciferol (VITAMIN D) 1000 UNITS tablet Take 1 tablet (1,000 Units total) by mouth daily. 30 tablet 3  . cyclobenzaprine (FLEXERIL) 10 MG tablet Take 1.5 tablets (15 mg total) by mouth 2 (two) times  daily as needed for muscle spasms. 90 tablet 2  . esomeprazole (NEXIUM) 40 MG capsule Take 40 mg by mouth daily before breakfast.    . fluticasone (FLONASE) 50 MCG/ACT nasal spray Place 2 sprays into both nostrils daily. 16 g 0  . hydrochlorothiazide (MICROZIDE) 12.5 MG capsule TAKE ONE CAPSULE BY MOUTH ONCE DAILY 30 capsule 5  . lisinopril (PRINIVIL,ZESTRIL) 5 MG tablet Take 1 tablet (5 mg total) by mouth daily. 30 tablet 3  . meloxicam (MOBIC) 7.5 MG tablet Take 1 tablet (7.5 mg total) by mouth daily as needed for pain. 30 tablet 3  . senna-docusate (SENOKOT-S) 8.6-50 MG per tablet Take 2 tablets by mouth daily as needed for mild constipation. 30 tablet 1  . sertraline (ZOLOFT) 50 MG tablet Take 1 tablet (50 mg total) by mouth daily. 30 tablet 2  . sodium chloride (OCEAN) 0.65 % SOLN nasal spray Place 1 spray into the nose daily. 1 Bottle 0  . traMADol (ULTRAM) 50 MG tablet Take 1 tablet (50 mg total) by mouth every 12 (twelve) hours as needed. 30 tablet 2   No current facility-administered medications on file prior to visit.    Today's Vitals   10/06/14 1410  BP: 123/57  Pulse: 54  Temp: 98.3 F (36.8 C)  TempSrc: Oral   Objective:  Physical Exam  Constitutional: She is oriented to person, place, and time. She appears well-developed and well-nourished. No distress.  HENT:  Head: Normocephalic and atraumatic.  Eyes:  EOM are normal.  Neck: Neck supple. No thyromegaly present.  Cardiovascular: Regular rhythm.   No murmur heard. Pulmonary/Chest: Breath sounds normal. She has no wheezes.  Abdominal: Soft. She exhibits no distension. There is no tenderness.  Musculoskeletal: Normal range of motion.  Neurological: She is alert and oriented to person, place, and time.  Skin: Skin is warm and dry.   Assessment & Plan:  Please refer to problem based charting.

## 2014-10-06 NOTE — Assessment & Plan Note (Addendum)
BP Readings from Last 3 Encounters:  10/06/14 123/57  10/02/14 133/70  08/21/14 145/63    Lab Results  Component Value Date   NA 143 10/06/2014   K 3.7 10/06/2014   CREATININE 0.68 10/06/2014    Assessment: Blood pressure control: controlled Progress toward BP goal:  at goal  Plan: Medications:  Continue HCTZ 12.5mg  daily, lisinopril 5mg  daily.

## 2014-10-06 NOTE — Assessment & Plan Note (Addendum)
Assessment: Upon reviewing EMR, she has had HR as low as 50 in past encounters. Her HR at last clinic visit was 35 on 10/02/2014. HR 54 today. Takes 50mg  of tramadol at night and Flexeril 10mg  at night. Hemodynamically stable. EKG with sinus bradycardia and T wave changes (inversion in II, III, aVR, aVL, V4-5, flattening in aVF). Stat BMP and troponin unremarkable.  Plan: Likely asymptomatic sinus bradycardia.  -Also sent for TSH and CBC. -If negative may be considered for exercise tolerance test in future.   Addendum: CBC with neutropenia which is at her baseline. TSH wnl.

## 2014-10-07 NOTE — Progress Notes (Signed)
[facility] Clinic Attending  I saw and evaluated the patient.  I personally confirmed the key portions of the history and exam documented by Dr. Krall and I reviewed pertinent patient test results.  The assessment, diagnosis, and plan were formulated together and I agree with the documentation in the resident's note.  

## 2014-10-19 ENCOUNTER — Ambulatory Visit: Payer: Self-pay

## 2014-10-20 ENCOUNTER — Other Ambulatory Visit: Payer: Self-pay | Admitting: *Deleted

## 2014-10-20 DIAGNOSIS — I10 Essential (primary) hypertension: Secondary | ICD-10-CM

## 2014-10-20 MED ORDER — LISINOPRIL 5 MG PO TABS: 5.0000 mg | ORAL_TABLET | Freq: Every day | ORAL | Status: AC

## 2014-12-01 ENCOUNTER — Ambulatory Visit: Payer: Self-pay | Admitting: Internal Medicine

## 2014-12-03 ENCOUNTER — Other Ambulatory Visit: Payer: Self-pay | Admitting: *Deleted

## 2014-12-03 DIAGNOSIS — M797 Fibromyalgia: Secondary | ICD-10-CM

## 2014-12-03 MED ORDER — CELECOXIB 100 MG PO CAPS: 100.0000 mg | ORAL_CAPSULE | Freq: Two times a day (BID) | ORAL | Status: AC

## 2014-12-03 NOTE — Telephone Encounter (Signed)
Called [phone]

## 2015-10-05 ENCOUNTER — Encounter: Payer: Self-pay | Admitting: Student

## 2015-11-17 IMAGING — US US ABDOMEN COMPLETE
1 series · 14 of 25 positions shown · non-contrast
Comparison: None.

CLINICAL DATA: Periumbilical pain for approximately 1 year.

EXAM:
ULTRASOUND ABDOMEN COMPLETE

[Series 1: us abdomen complete · 0.43mm/px · 14 of 107 slices shown]
[im 1/107]
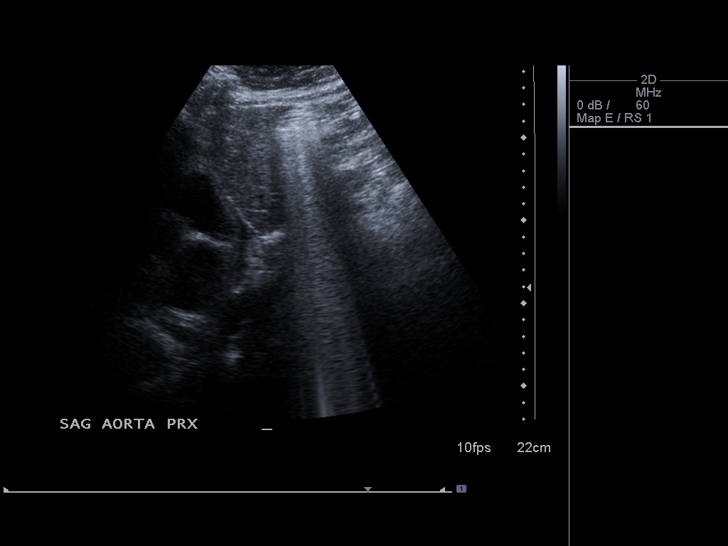
[im 9/107]
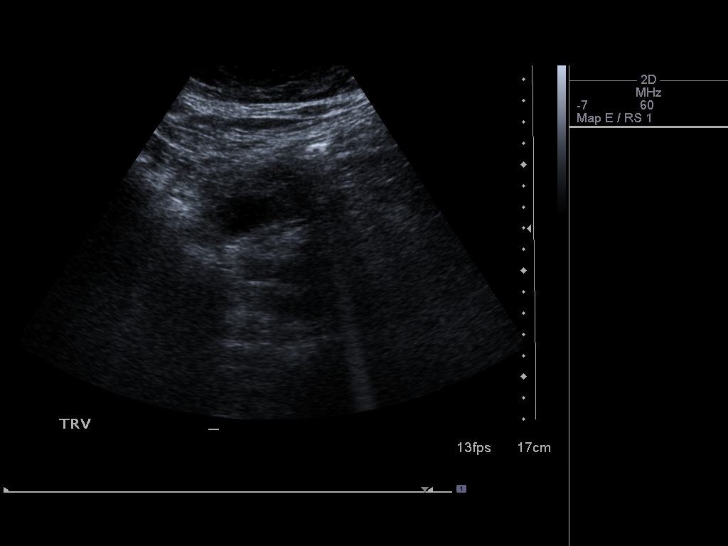
[im 18/107]
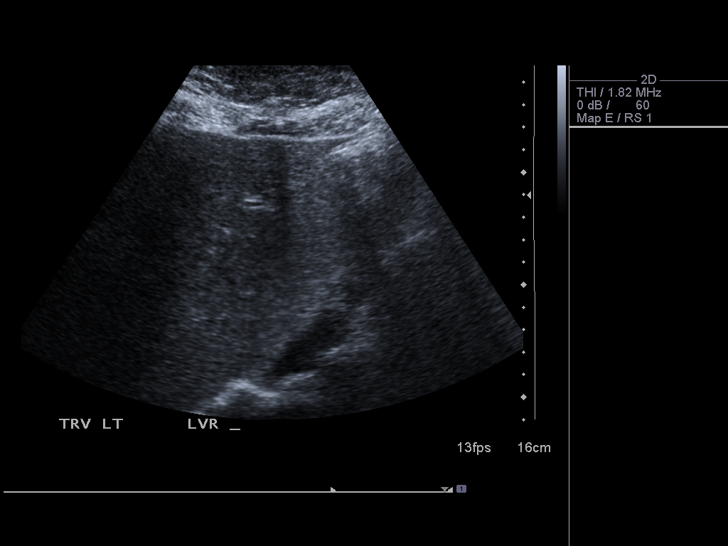
[im 27/107]
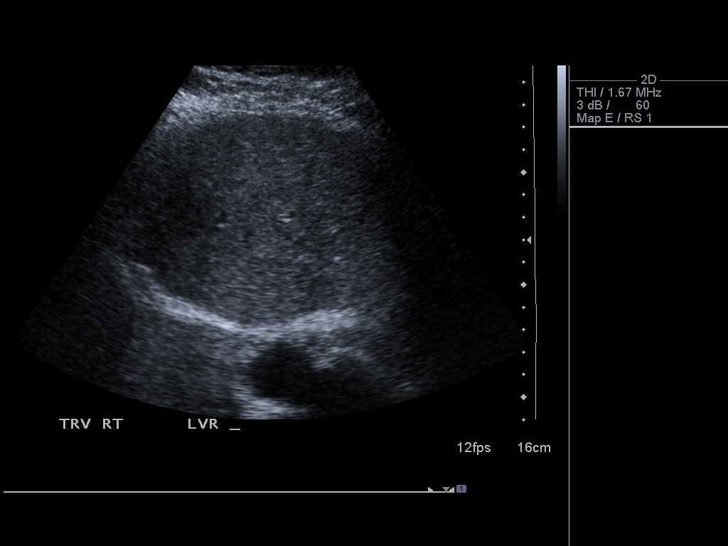
[im 36/107]
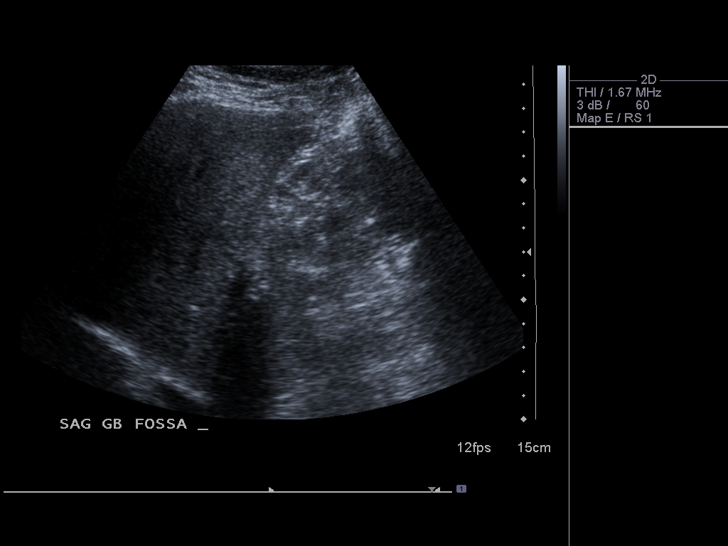
[im 40/107]
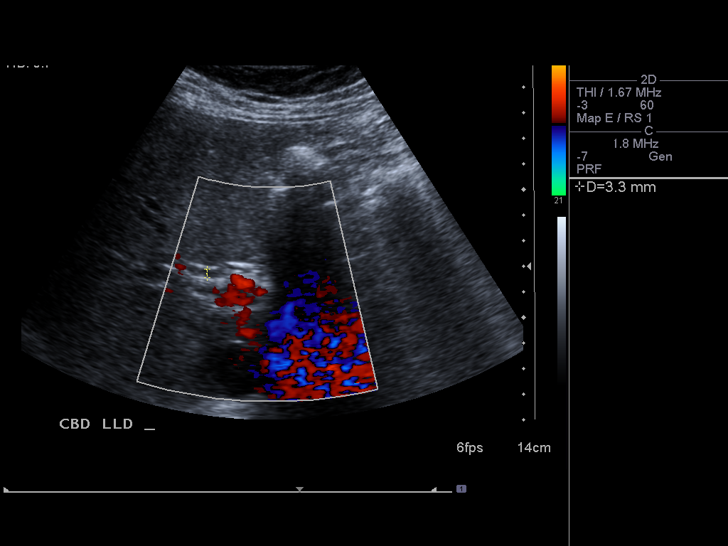
[im 49/107]
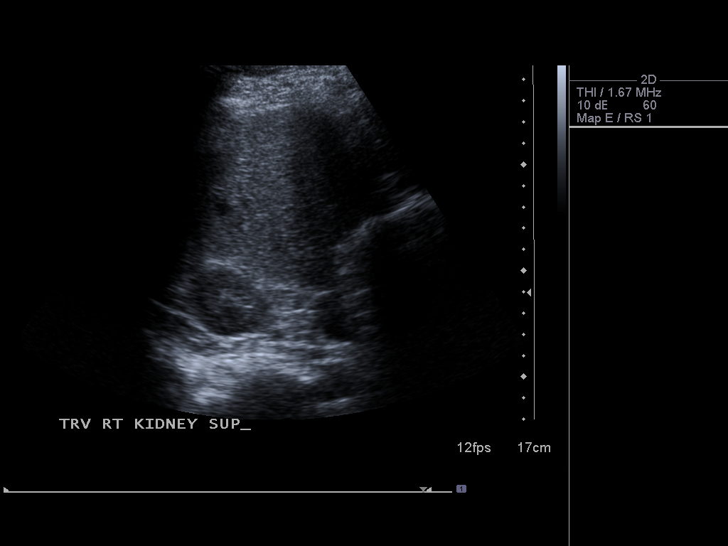
[im 58/107]
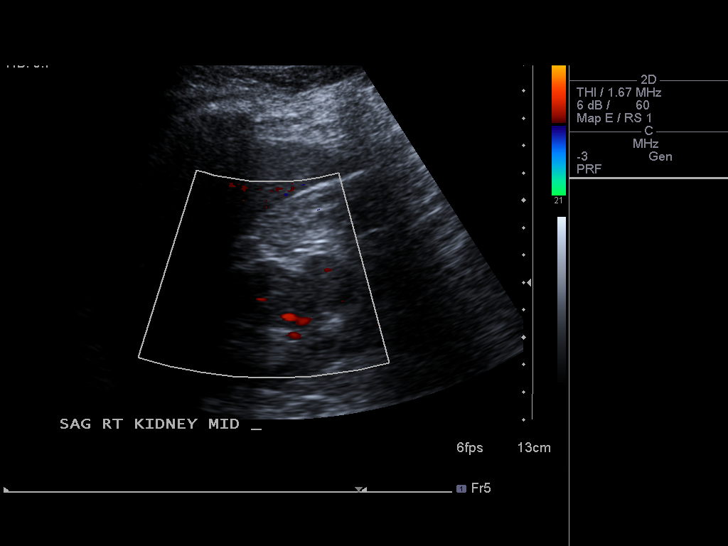
[im 67/107]
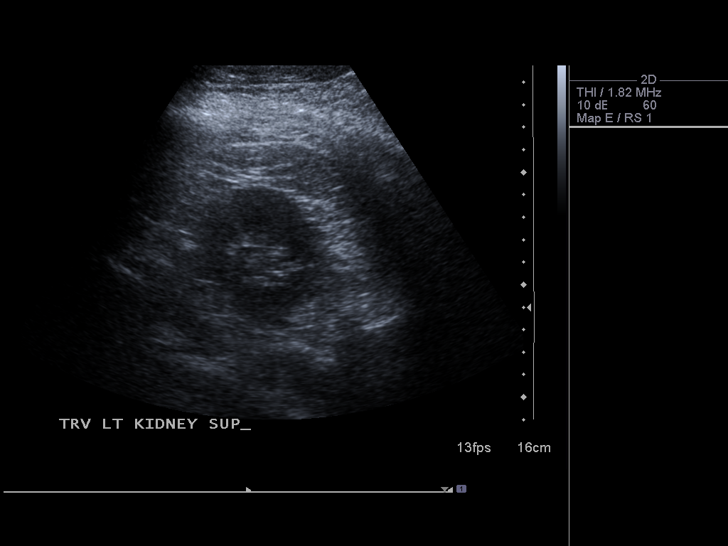
[im 71/107]
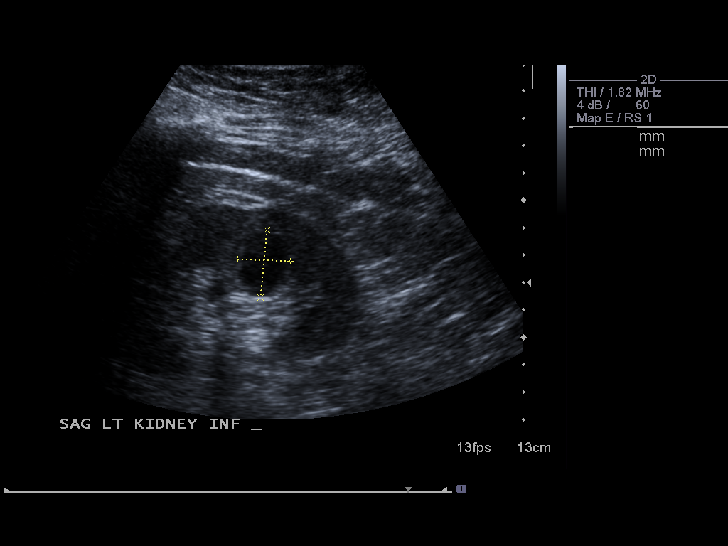
[im 80/107]
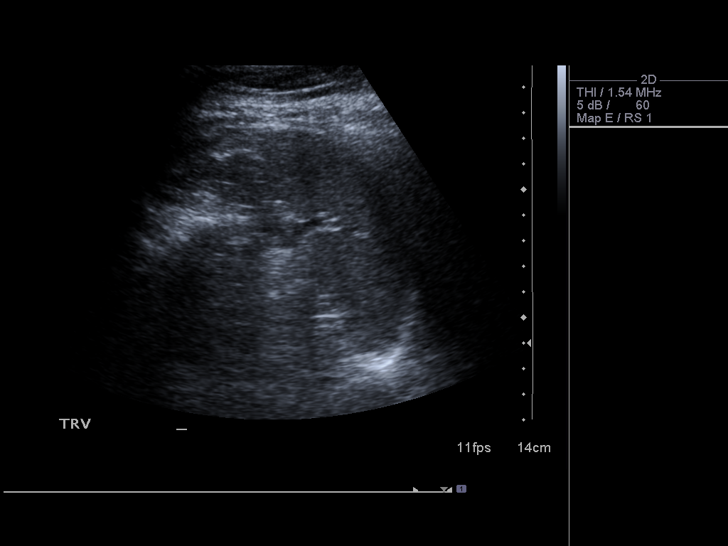
[im 89/107]
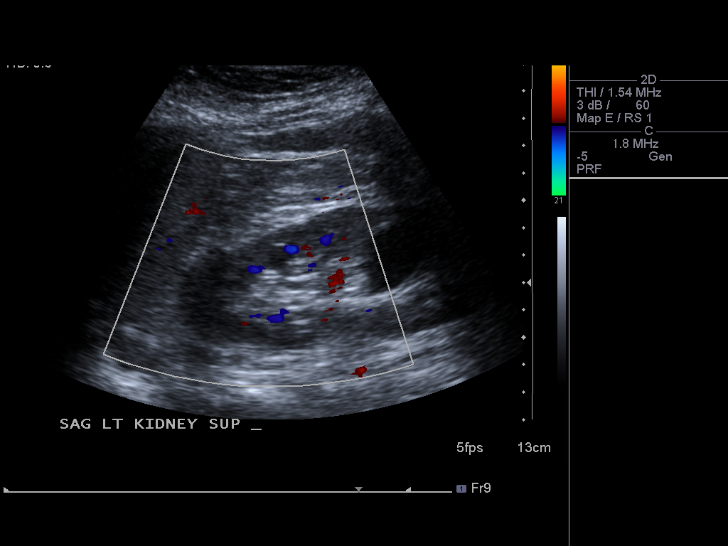
[im 98/107]
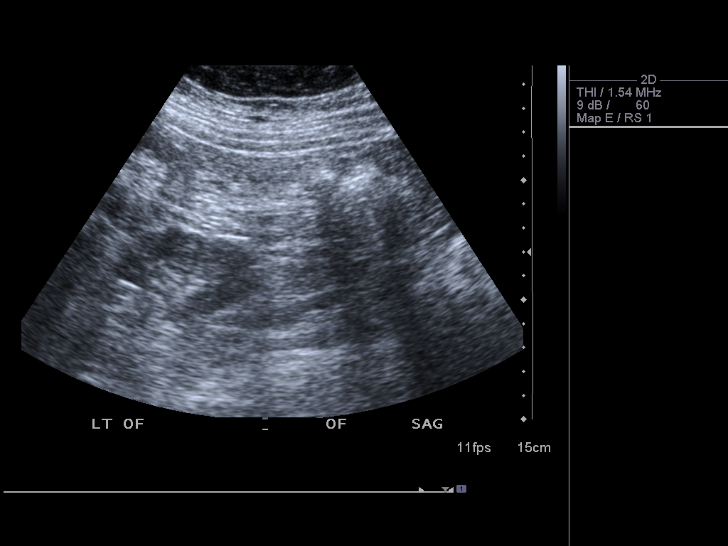
[im 107/107]
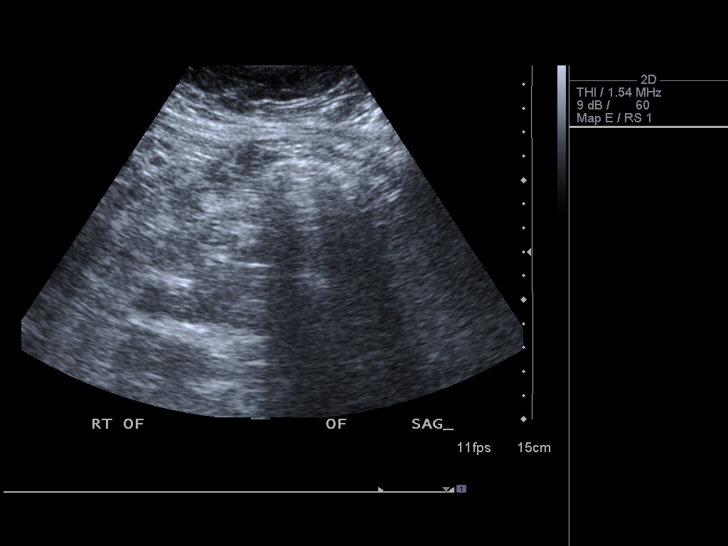

[14 of 25 positions shown; findings below may reference images not displayed]

FINDINGS: Gallbladder:

Removed.

Common bile duct:

Diameter: 0.3 cm

Liver:

No focal lesion identified. Within normal limits in parenchymal
echogenicity.

IVC:

No abnormality visualized.

Pancreas:

Visualized portion unremarkable.

Spleen:

Size and appearance within normal limits.

Right Kidney:

Length: 11.0 cm.. No stone or hydronephrosis. A small area of
increased echogenicity in the midpole measuring 1.2 cm may represent
scar or possibly a tiny angiomyolipoma.

Left Kidney:

Length: 11.1 cm. No stone or hydronephrosis. 2 cysts are identified.
The larger measures 2.5 cm.

Abdominal aorta:

No aneurysm visualized.

Other findings:

Scanning in the region of concern demonstrates no abnormality.
IMPRESSION: No acute finding.  Status post cholecystectomy.
